# Patient Record
Sex: Female | Born: 1975 | Race: White | Hispanic: No | Marital: Married | Smoking: Former smoker
Health system: Southern US, Community
[De-identification: ages and names within clinical notes are randomized; demographics above are authoritative.]

## PROBLEM LIST (undated history)

## (undated) DIAGNOSIS — J189 Pneumonia, unspecified organism: Secondary | ICD-10-CM

## (undated) DIAGNOSIS — J45909 Unspecified asthma, uncomplicated: Secondary | ICD-10-CM

## (undated) DIAGNOSIS — I2699 Other pulmonary embolism without acute cor pulmonale: Secondary | ICD-10-CM

## (undated) DIAGNOSIS — M549 Dorsalgia, unspecified: Secondary | ICD-10-CM

## (undated) DIAGNOSIS — F909 Attention-deficit hyperactivity disorder, unspecified type: Secondary | ICD-10-CM

## (undated) HISTORY — PX: TONSILLECTOMY: SUR1361

## (undated) HISTORY — PX: BACK SURGERY: SHX140

## (undated) HISTORY — DX: Pneumonia, unspecified organism: J18.9

---

## 2015-07-24 ENCOUNTER — Other Ambulatory Visit: Payer: Self-pay | Admitting: Pediatrics

## 2015-07-24 DIAGNOSIS — Z1231 Encounter for screening mammogram for malignant neoplasm of breast: Secondary | ICD-10-CM

## 2015-08-11 ENCOUNTER — Ambulatory Visit: Payer: Self-pay

## 2016-03-20 ENCOUNTER — Emergency Department: Payer: BLUE CROSS/BLUE SHIELD

## 2016-03-20 ENCOUNTER — Emergency Department
Admission: EM | Admit: 2016-03-20 | Discharge: 2016-03-20 | Disposition: A | Discharge status: Home / Self Care | Payer: BLUE CROSS/BLUE SHIELD | Attending: Emergency Medicine | Admitting: Emergency Medicine

## 2016-03-20 DIAGNOSIS — F909 Attention-deficit hyperactivity disorder, unspecified type: Secondary | ICD-10-CM | POA: Insufficient documentation

## 2016-03-20 DIAGNOSIS — R0602 Shortness of breath: Secondary | ICD-10-CM | POA: Diagnosis not present

## 2016-03-20 DIAGNOSIS — Z87891 Personal history of nicotine dependence: Secondary | ICD-10-CM | POA: Diagnosis not present

## 2016-03-20 HISTORY — DX: Attention-deficit hyperactivity disorder, unspecified type: F90.9

## 2016-03-20 LAB — COMPREHENSIVE METABOLIC PANEL
ALBUMIN: 3.9 g/dL (ref 3.5–5.0)
ALT: 26 U/L (ref 14–54)
ANION GAP: 7 (ref 5–15)
AST: 22 U/L (ref 15–41)
Alkaline Phosphatase: 49 U/L (ref 38–126)
BILIRUBIN TOTAL: 0.5 mg/dL (ref 0.3–1.2)
BUN: 13 mg/dL (ref 6–20)
CHLORIDE: 107 mmol/L (ref 101–111)
CO2: 24 mmol/L (ref 22–32)
Calcium: 9.3 mg/dL (ref 8.9–10.3)
Creatinine, Ser: 0.81 mg/dL (ref 0.44–1.00)
GFR calc Af Amer: >60 mL/min (ref >60)
GLUCOSE: 147 mg/dL — AB (ref 65–99)
POTASSIUM: 4.2 mmol/L (ref 3.5–5.1)
Sodium: 138 mmol/L (ref 135–145)
TOTAL PROTEIN: 7.1 g/dL (ref 6.5–8.1)

## 2016-03-20 LAB — INFLUENZA PANEL BY PCR (TYPE A & B)
Influenza A By PCR: NEGATIVE
Influenza B By PCR: NEGATIVE

## 2016-03-20 LAB — CBC WITH DIFFERENTIAL/PLATELET
BASOS ABS: 0.1 10*3/uL (ref 0–0.1)
Basophils Relative: 1 %
Eosinophils Absolute: 0.4 10*3/uL (ref 0–0.7)
Eosinophils Relative: 4 %
HEMATOCRIT: 43.1 % (ref 35.0–47.0)
HEMOGLOBIN: 14.5 g/dL (ref 12.0–16.0)
LYMPHS ABS: 3.4 10*3/uL (ref 1.0–3.6)
LYMPHS PCT: 36 %
MCH: 29.6 pg (ref 26.0–34.0)
MCHC: 33.6 g/dL (ref 32.0–36.0)
MCV: 88.3 fL (ref 80.0–100.0)
Monocytes Absolute: 0.6 10*3/uL (ref 0.2–0.9)
Monocytes Relative: 6 %
NEUTROS ABS: 4.9 10*3/uL (ref 1.4–6.5)
Neutrophils Relative %: 53 %
Platelets: 306 10*3/uL (ref 150–440)
RBC: 4.88 MIL/uL (ref 3.80–5.20)
RDW: 12.7 % (ref 11.5–14.5)
WBC: 9.3 10*3/uL (ref 3.6–11.0)

## 2016-03-20 LAB — TROPONIN I

## 2016-03-20 LAB — FIBRIN DERIVATIVES D-DIMER (ARMC ONLY): FIBRIN DERIVATIVES D-DIMER (ARMC): 263 (ref 0–499)

## 2016-03-20 MED ORDER — PREDNISONE 20 MG PO TABS
ORAL_TABLET | ORAL | Status: AC
  Filled 2016-03-20: qty 3

## 2016-03-20 MED ORDER — PREDNISONE 20 MG PO TABS
60.0000 mg | ORAL_TABLET | Freq: Once | ORAL | Status: AC
  Administered 2016-03-20: 60 mg via ORAL

## 2016-03-20 MED ORDER — PREDNISONE 20 MG PO TABS: 40.0000 mg | ORAL_TABLET | Freq: Every day | ORAL | 0 refills | Status: DC

## 2016-03-20 NOTE — ED Provider Notes (Signed)
[facility] Emergency Department Provider Note  Time seen: 6:16 PM  I have reviewed the triage vital signs and the nursing notes.   HISTORY  Chief Complaint Shortness of Breath    HPI Dana Lewis is a 41 y.o. female with a past medical history of ADHD who presents to the emergency department with shortness breath. According to the patient her and her husband had gotten over nor virus approximately one week ago however for the past one week the patient has been feeling short of breath. Denies any fever or cough. Was seen by her primary care doctor 5 days ago and diagnosed with a likely bacterial illness other the patient states the chest x-ray she took was negative. She was given a course of Zithromax. Patient completed this yesterday but continues to feel short of breath especially with exertion. Patient states a history of shortness breath for which she is prescribed an albuterol inhaler, she has been using but continues to feel like she cannot get a full deep breath of air in. Denies any chest pain. Denies any leg pain or swelling. Denies any hormone use. States one episode of nausea yesterday but none today.  Past Medical History:  Diagnosis Date  . ADHD     There are no active problems to display for this patient.   Past Surgical History:  Procedure Laterality Date  . BACK SURGERY     2016    Prior to Admission medications   Not on File    No Known Allergies  No family history on file.  Social History Social History  Substance Use Topics  . Smoking status: Former Research scientist (life sciences)  . Smokeless tobacco: Never Used  . Alcohol use Not on file    Review of Systems Constitutional: Negative for fever. Cardiovascular: Negative for chest pain. Respiratory: Positive for shortness of breath. Gastrointestinal: Negative for abdominal pain Neurological: Negative for headache 10-point ROS otherwise  negative.  ____________________________________________   PHYSICAL EXAM:  VITAL SIGNS: ED Triage Vitals [03/20/16 1528]  Enc Vitals Group     BP (!) 131/93     Pulse Rate (!) 109     Resp 20     Temp 98.4 F (36.9 C)     Temp Source Oral     SpO2 96 %     Weight 221 lb (100.2 kg)     Height 5\' 3"  (1.6 m)     Head Circumference      Peak Flow      Pain Score 0     Pain Loc      Pain Edu?      Excl. in [place]?     Constitutional: Alert and oriented. Well appearing and in no distress. Eyes: Normal exam ENT   Head: Normocephalic and atraumatic   Mouth/Throat: Mucous membranes are moist. Cardiovascular: Normal rate, regular rhythmAround 100 bpm. No murmur. Respiratory: Normal respiratory effort without tachypnea nor retractions. Breath sounds are clear  Gastrointestinal: Soft and nontender. No distention.   Musculoskeletal: Nontender with normal range of motion in all extremities.  Neurologic:  Normal speech and language. No gross focal neurologic deficits Skin:  Skin is warm, dry and intact.  Psychiatric: Mood and affect are normal.  ____________________________________________    EKG  EKG reviewed and interpreted by myself shows normal sinus rhythm at 97 bpm, narrow QRS, normal axis, normal intervals, no concerning ST changes.  ____________________________________________    RADIOLOGY  Chest x-ray negative  ____________________________________________   INITIAL IMPRESSION / ASSESSMENT  AND PLAN / ED COURSE  Pertinent labs & imaging results that were available during my care of the patient were reviewed by me and considered in my medical decision making (see chart for details).  Patient presents emergency department with shortness of breath, feeling like she cannot get a full breath of air in. Denies any chest pain. Overall the patient appears very well, clear lung sounds, no wheeze, largely normal vitals besides mild tachycardia. Given the patient's mild  tachycardia with shortness of breath symptoms we will obtain a d-dimer and troponin. We will also obtain an influenza swab. Overall the patient appears very well, no distress.  Patient's labs are largely within normal limits. Influenza negative. D-dimer negative. Troponin negative. Patient's chest x-ray is normal. EKG is reassuring. As the patient is already undergone a trial of antibiotics we will place the patient on a short course of steroids and have her follow-up with a pulmonologist. Patient will call Monday to arrange a follow-up appointment.  ____________________________________________   FINAL CLINICAL IMPRESSION(S) / ED DIAGNOSES  Shortness of breath    Harvest Dark, MD 03/20/16 [phone]

## 2016-03-20 NOTE — ED Triage Notes (Signed)
Pt reports to ED w/ c/o SHOB.  Pt sts that she was seen Monday at PCP and dx w/ bacterial lung infection and prescribed Z-pack.  Pt sts that she was feeling better until yesterday when she began to feel [facility] again.  Pt sts she took all zpack.  Denies CP, fevers or LOC.  +PO intake.

## 2016-03-20 NOTE — Discharge Instructions (Signed)
Please call the number provided for pulmonology to arrange a follow-up appointment. Return to the emergency department for any chest pain, worsening trouble breathing, or any other symptom personally concerning to self. Please take your prescribed prednisone for its entire course as written.

## 2016-03-20 NOTE — ED Notes (Signed)

## 2016-03-25 LAB — CULTURE, BLOOD (ROUTINE X 2): Culture: NO GROWTH

## 2016-03-29 ENCOUNTER — Encounter: Payer: Self-pay | Admitting: Radiology

## 2016-03-29 ENCOUNTER — Observation Stay
Admission: EM | Admit: 2016-03-29 | Discharge: 2016-03-30 | Disposition: A | Discharge status: Home / Self Care | Payer: BLUE CROSS/BLUE SHIELD | Attending: Internal Medicine | Admitting: Internal Medicine

## 2016-03-29 ENCOUNTER — Emergency Department: Payer: BLUE CROSS/BLUE SHIELD

## 2016-03-29 ENCOUNTER — Observation Stay: Payer: BLUE CROSS/BLUE SHIELD

## 2016-03-29 ENCOUNTER — Observation Stay (HOSPITAL_BASED_OUTPATIENT_CLINIC_OR_DEPARTMENT_OTHER)
Admit: 2016-03-29 | Discharge: 2016-03-29 | Disposition: A | Discharge status: Home / Self Care | Payer: BLUE CROSS/BLUE SHIELD | Attending: Internal Medicine | Admitting: Internal Medicine

## 2016-03-29 DIAGNOSIS — R000 Tachycardia, unspecified: Secondary | ICD-10-CM | POA: Insufficient documentation

## 2016-03-29 DIAGNOSIS — F909 Attention-deficit hyperactivity disorder, unspecified type: Secondary | ICD-10-CM | POA: Insufficient documentation

## 2016-03-29 DIAGNOSIS — Z87891 Personal history of nicotine dependence: Secondary | ICD-10-CM | POA: Diagnosis not present

## 2016-03-29 DIAGNOSIS — I2699 Other pulmonary embolism without acute cor pulmonale: Principal | ICD-10-CM | POA: Insufficient documentation

## 2016-03-29 DIAGNOSIS — R0602 Shortness of breath: Secondary | ICD-10-CM

## 2016-03-29 DIAGNOSIS — Z91018 Allergy to other foods: Secondary | ICD-10-CM | POA: Diagnosis not present

## 2016-03-29 DIAGNOSIS — F419 Anxiety disorder, unspecified: Secondary | ICD-10-CM | POA: Insufficient documentation

## 2016-03-29 DIAGNOSIS — R0789 Other chest pain: Secondary | ICD-10-CM | POA: Insufficient documentation

## 2016-03-29 DIAGNOSIS — R609 Edema, unspecified: Secondary | ICD-10-CM

## 2016-03-29 DIAGNOSIS — M7989 Other specified soft tissue disorders: Secondary | ICD-10-CM | POA: Insufficient documentation

## 2016-03-29 HISTORY — DX: Dorsalgia, unspecified: M54.9

## 2016-03-29 LAB — CBC
HCT: 42 % (ref 35.0–47.0)
HEMOGLOBIN: 14.4 g/dL (ref 12.0–16.0)
MCH: 30 pg (ref 26.0–34.0)
MCHC: 34.2 g/dL (ref 32.0–36.0)
MCV: 87.6 fL (ref 80.0–100.0)
PLATELETS: 332 10*3/uL (ref 150–440)
RBC: 4.8 MIL/uL (ref 3.80–5.20)
RDW: 13.1 % (ref 11.5–14.5)
WBC: 13.5 10*3/uL — AB (ref 3.6–11.0)

## 2016-03-29 LAB — ECHOCARDIOGRAM COMPLETE
E/e' ratio: 5.49
EWDT: 250 ms
FS: 34 % (ref 28–44)
HEIGHTINCHES: 63 in
IVS/LV PW RATIO, ED: 0.92
LA ID, A-P, ES: 36 mm
LA diam end sys: 36 mm
LA diam index: 1.66 cm/m2
LAVOLA4C: 38.3 mL
LV PW d: 8.59 mm — AB (ref 0.6–1.1)
LV TDI E'MEDIAL: 11.1
LV e' LATERAL: 17.5 cm/s
LVEEAVG: 5.49
LVEEMED: 5.49
MV Dec: 250
MV Peak grad: 4 mmHg
MV pk A vel: 110 m/s
MV pk E vel: 96 m/s
RV LATERAL S' VELOCITY: 12.9 cm/s
RV TAPSE: 23.5 mm
TDI e' lateral: 17.5
Weight: 3555.2 oz

## 2016-03-29 LAB — BASIC METABOLIC PANEL
ANION GAP: 9 (ref 5–15)
BUN: 11 mg/dL (ref 6–20)
CALCIUM: 9.4 mg/dL (ref 8.9–10.3)
CHLORIDE: 102 mmol/L (ref 101–111)
CO2: 25 mmol/L (ref 22–32)
Creatinine, Ser: 0.97 mg/dL (ref 0.44–1.00)
GFR calc non Af Amer: >60 mL/min (ref >60)
Glucose, Bld: 125 mg/dL — ABNORMAL HIGH (ref 65–99)
Potassium: 3.6 mmol/L (ref 3.5–5.1)
SODIUM: 136 mmol/L (ref 135–145)

## 2016-03-29 LAB — ANTITHROMBIN III: ANTITHROMB III FUNC: 111 % (ref 75–120)

## 2016-03-29 LAB — PROTIME-INR
INR: 1.86
Prothrombin Time: 21.7 seconds — ABNORMAL HIGH (ref 11.4–15.2)

## 2016-03-29 LAB — TROPONIN I

## 2016-03-29 LAB — BRAIN NATRIURETIC PEPTIDE: B Natriuretic Peptide: 29 pg/mL (ref 0.0–100.0)

## 2016-03-29 LAB — APTT: aPTT: 32 seconds (ref 24–36)

## 2016-03-29 MED ORDER — SODIUM CHLORIDE 0.9 % IV SOLN
INTRAVENOUS | Status: DC
  Administered 2016-03-29 – 2016-03-30 (×2): via INTRAVENOUS

## 2016-03-29 MED ORDER — IOPAMIDOL (ISOVUE-370) INJECTION 76%
75.0000 mL | Freq: Once | INTRAVENOUS | Status: AC | PRN
  Administered 2016-03-29: 75 mL via INTRAVENOUS

## 2016-03-29 MED ORDER — LISDEXAMFETAMINE DIMESYLATE 70 MG PO CAPS
70.0000 mg | ORAL_CAPSULE | Freq: Every day | ORAL | Status: DC
  Administered 2016-03-29 – 2016-03-30 (×2): 70 mg via ORAL
  Filled 2016-03-29 (×2): qty 1

## 2016-03-29 MED ORDER — DEXTROAMPHETAMINE SULFATE 5 MG PO TABS: 10.0000 mg | ORAL_TABLET | Freq: Every day | ORAL | Status: DC | PRN

## 2016-03-29 MED ORDER — IPRATROPIUM-ALBUTEROL 0.5-2.5 (3) MG/3ML IN SOLN
3.0000 mL | Freq: Once | RESPIRATORY_TRACT | Status: AC
  Administered 2016-03-29: 3 mL via RESPIRATORY_TRACT
  Filled 2016-03-29: qty 3

## 2016-03-29 MED ORDER — RIVAROXABAN (XARELTO) VTE STARTER PACK (15 & 20 MG): ORAL_TABLET | ORAL | 0 refills | Status: DC

## 2016-03-29 MED ORDER — LORAZEPAM 0.5 MG PO TABS
0.5000 mg | ORAL_TABLET | Freq: Once | ORAL | Status: AC
  Administered 2016-03-29: 0.5 mg via ORAL
  Filled 2016-03-29: qty 1

## 2016-03-29 MED ORDER — POLYETHYLENE GLYCOL 3350 17 G PO PACK: 17.0000 g | PACK | Freq: Every day | ORAL | Status: DC | PRN

## 2016-03-29 MED ORDER — RIVAROXABAN 15 MG PO TABS
15.0000 mg | ORAL_TABLET | Freq: Once | ORAL | Status: AC
  Administered 2016-03-29: 15 mg via ORAL
  Filled 2016-03-29: qty 1

## 2016-03-29 MED ORDER — KETOROLAC TROMETHAMINE 30 MG/ML IJ SOLN
30.0000 mg | Freq: Four times a day (QID) | INTRAMUSCULAR | Status: DC | PRN
  Administered 2016-03-29: 12:00:00 30 mg via INTRAVENOUS
  Filled 2016-03-29: qty 1

## 2016-03-29 MED ORDER — TRAMADOL HCL 50 MG PO TABS
50.0000 mg | ORAL_TABLET | Freq: Two times a day (BID) | ORAL | Status: DC
  Administered 2016-03-29 (×2): 50 mg via ORAL
  Filled 2016-03-29 (×2): qty 1

## 2016-03-29 MED ORDER — SODIUM CHLORIDE 0.9 % IV BOLUS (SEPSIS)
1000.0000 mL | Freq: Once | INTRAVENOUS | Status: AC
  Administered 2016-03-29: 1000 mL via INTRAVENOUS

## 2016-03-29 MED ORDER — ALPRAZOLAM 0.5 MG PO TABS
0.5000 mg | ORAL_TABLET | Freq: Three times a day (TID) | ORAL | Status: DC | PRN
  Administered 2016-03-29 – 2016-03-30 (×2): 0.5 mg via ORAL
  Filled 2016-03-29 (×2): qty 1

## 2016-03-29 MED ORDER — ACETAMINOPHEN 325 MG PO TABS: 650.0000 mg | ORAL_TABLET | Freq: Four times a day (QID) | ORAL | Status: DC | PRN

## 2016-03-29 MED ORDER — DEXTROAMPHETAMINE SULFATE 5 MG PO TABS: 20.0000 mg | ORAL_TABLET | Freq: Every day | ORAL | Status: DC | PRN

## 2016-03-29 MED ORDER — IPRATROPIUM-ALBUTEROL 0.5-2.5 (3) MG/3ML IN SOLN: 3.0000 mL | Freq: Four times a day (QID) | RESPIRATORY_TRACT | Status: DC | PRN

## 2016-03-29 MED ORDER — HEPARIN (PORCINE) IN NACL 100-0.45 UNIT/ML-% IJ SOLN
1100.0000 [IU]/h | INTRAMUSCULAR | Status: DC
  Administered 2016-03-29: 1000 [IU]/h via INTRAVENOUS
  Filled 2016-03-29: qty 250

## 2016-03-29 MED ORDER — ACETAMINOPHEN 650 MG RE SUPP
650.0000 mg | Freq: Four times a day (QID) | RECTAL | Status: DC | PRN
Start: 2016-03-29 — End: 2016-03-30

## 2016-03-29 MED ORDER — DOCUSATE SODIUM 100 MG PO CAPS
100.0000 mg | ORAL_CAPSULE | Freq: Two times a day (BID) | ORAL | Status: DC
  Administered 2016-03-29 – 2016-03-30 (×3): 100 mg via ORAL
  Filled 2016-03-29 (×3): qty 1

## 2016-03-29 MED ORDER — IBUPROFEN 400 MG PO TABS
400.0000 mg | ORAL_TABLET | Freq: Four times a day (QID) | ORAL | Status: DC | PRN
  Filled 2016-03-29: qty 1

## 2016-03-29 NOTE — ED Provider Notes (Signed)
[facility] Emergency Department Provider Note   ____________________________________________   First MD Initiated Contact with Patient 03/29/16 0319     (approximate)  I have reviewed the triage vital signs and the nursing notes.   HISTORY  Chief Complaint Shortness of Breath and Chest Pain    HPI Dana Lewis is a 41 y.o. female who comes into the hospital today with shortness of breath. The patient reports that she just can't catch her breath. She reports that she thought it had been getting better she was seen here on the third but today at lunch the shortness of breath returned. She reports that she's been having to use her inhaler all day. She was here last aware was told that she had some inflammation in her left lung and she is unsure if that might cause any of her symptoms. 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 When she laid down and was more difficult to breathe. The patient reports that the inhaler all day has not been helping. She also had some pain and discomfort in her left upper chest. She reports that prior to her being seen here should been placed on a Z-Pak by her primary care physician. She reports that she just feels like she cannot catch a full breath. The patient rates her pain a 7 out of 10 in intensity. She is here today for evaluation.   Past Medical History:  Diagnosis Date  . ADHD     There are no active problems to display for this patient.   Past Surgical History:  Procedure Laterality Date  . BACK SURGERY     2016    Prior to Admission medications   Medication Sig Start Date End Date Taking? Authorizing Provider  albuterol (PROVENTIL HFA;VENTOLIN HFA) 108 (90 Base) MCG/ACT inhaler Inhale 2 puffs into the lungs every 4 (four) hours as needed for wheezing or shortness of breath.   Yes Historical Provider, MD  dextroamphetamine (DEXTROSTAT) 10 MG tablet Take 10-20 mg by mouth daily as needed  (attention). Patient takes as needed around 15:00    Yes Historical Provider, MD  lisdexamfetamine (VYVANSE) 70 MG capsule Take 70 mg by mouth daily.   Yes Historical Provider, MD  Rivaroxaban 15 & 20 MG TBPK Take as directed on package: Start with one 15mg  tablet by mouth twice a day with food. On Day 22, switch to one 20mg  tablet once a day with food. 03/29/16   Loney Hering, MD    Allergies Blueberry flavor  No family history on file.  Social History Social History  Substance Use Topics  . Smoking status: Former Research scientist (life sciences)  . Smokeless tobacco: Never Used  . Alcohol use Not on file    Review of Systems Constitutional: No fever/chills Eyes: No visual changes. ENT: No sore throat. Cardiovascular:  chest pain. Respiratory:  shortness of breath. Gastrointestinal: No abdominal pain.  No nausea, no vomiting.  No diarrhea.  No constipation. Genitourinary: Negative for dysuria. Musculoskeletal: Negative for back pain. Skin: Negative for rash. Neurological: Negative for headaches, focal weakness or numbness.  10-point ROS otherwise negative.  ____________________________________________   PHYSICAL EXAM:  VITAL SIGNS: ED Triage Vitals  Enc Vitals Group     BP 03/29/16 0232 119/81     Pulse Rate 03/29/16 0232 (!) 130     Resp 03/29/16 0232 15     Temp 03/29/16 0232 98.6 F (37 C)     Temp Source 03/29/16  0232 Oral     SpO2 03/29/16 0232 98 %     Weight 03/29/16 0233 221 lb (100.2 kg)     Height 03/29/16 0233 5\' 3"  (1.6 m)     Head Circumference --      Peak Flow --      Pain Score 03/29/16 0234 7     Pain Loc --      Pain Edu? --      Excl. in [place]? --     Constitutional: Alert and oriented. Well appearing and in Moderate respiratory distress. Eyes: Conjunctivae are normal. PERRL. EOMI. Head: Atraumatic. Nose: No congestion/rhinnorhea. Mouth/Throat: Mucous membranes are moist.  Oropharynx non-erythematous. Cardiovascular: Tachycardia, regular rhythm. Grossly normal  heart sounds.  Good peripheral circulation. Respiratory: Increased respiratory effort.  No retractions. Lungs CTAB. Gastrointestinal: Soft and nontender. No distention. Positive bowel sounds Musculoskeletal: No lower extremity tenderness nor edema.   Neurologic:  Normal speech and language.  Skin:  Skin is warm, dry and intact.  Psychiatric: Mood and affect are normal.   ____________________________________________   LABS (all labs ordered are listed, but only abnormal results are displayed)  Labs Reviewed  BASIC METABOLIC PANEL - Abnormal; Notable for the following:       Result Value   Glucose, Bld 125 (*)    All other components within normal limits  CBC - Abnormal; Notable for the following:    WBC 13.5 (*)    All other components within normal limits  TROPONIN I  BRAIN NATRIURETIC PEPTIDE   ____________________________________________  EKG  ED ECG REPORT I, Loney Hering, the attending physician, personally viewed and interpreted this ECG.   Date: 03/29/2016  EKG Time: 230  Rate: 129  Rhythm: sinus tachycardia  Axis: normal  Intervals:none  ST&T Change: none  ____________________________________________  RADIOLOGY  CXR CT angio chest ____________________________________________   PROCEDURES  Procedure(s) performed: None  Procedures  Critical Care performed: No  ____________________________________________   INITIAL IMPRESSION / ASSESSMENT AND PLAN / ED COURSE  Pertinent labs & imaging results that were available during my care of the patient were reviewed by me and considered in my medical decision making (see chart for details).  This is a 41 year old female who comes into the hospital today with some shortness of breath. The patient has been battling this for multiple weeks but she reports that she feels very short of breath and can't. The patient did receive a DuoNeb in triage and she reports that helped a little bit but not much. I will  give the patient a dose of Ativan as she is very anxious and scared about what is going on with her breathing. The patient did receive a chest x-ray and as she was seen previously and received a negative d-dimer I will send the patient for a CT angios of her chest. I will also check a BNP and a repeat troponin.  Clinical Course as of Mar 29 838  Mon Mar 29, 2016  0357 No active cardiopulmonary disease. DG Chest 2 View [AW]  [phone] Positive for segmental pulmonary embolus in the right posterior lower lobe. No evidence for right heart strain.   CT Angio Chest PE W and/or Wo Contrast [AW]    Clinical Course User Index [AW] Loney Hering, MD   The patient does have an pulmonary embolus. I did give her a dose of Xarelto. She is still tachycardic. I will give her a liter of normal saline. She still had somewhat tachycardia after the  liters I will give her a second liter. Her care will be signed out to the oncoming physician. If her tachycardia persists we will admit her to the hospitalist service. Not she will be discharged home with some anticoagulation and some follow-up with her primary care physician.  ____________________________________________   FINAL CLINICAL IMPRESSION(S) / ED DIAGNOSES  Final diagnoses:  Shortness of breath  Other acute pulmonary embolism without acute cor pulmonale (HCC)      NEW MEDICATIONS STARTED DURING THIS VISIT:  New Prescriptions   RIVAROXABAN 15 & 20 MG TBPK    Take as directed on package: Start with one 15mg  tablet by mouth twice a day with food. On Day 22, switch to one 20mg  tablet once a day with food.     Note:  This document was prepared using Dragon voice recognition software and may include unintentional dictation errors.    Loney Hering, MD 03/29/16 [phone]

## 2016-03-29 NOTE — ED Notes (Signed)
Patient transported to Ultrasound 

## 2016-03-29 NOTE — Progress Notes (Signed)
ANTICOAGULATION CONSULT NOTE - Initial Consult  Pharmacy Consult for Heparin dosing Indication: pulmonary embolus  Allergies  Allergen Reactions  . Blueberry Flavor Anaphylaxis    Patient Measurements: Height: 5\' 3"  (160 cm) Weight: 222 lb 3.2 oz (100.8 kg) IBW/kg (Calculated) : 52.4 Heparin Dosing Weight: 75.6 kg  Vital Signs: Temp: 97.8 F (36.6 C) (02/12 1138) Temp Source: Oral (02/12 1138) BP: 115/73 (02/12 1138) Pulse Rate: 100 (02/12 1138)  Labs:  Recent Labs  03/29/16 0248 03/29/16 0943  HGB 14.4  --   HCT 42.0  --   PLT 332  --   APTT  --  32  LABPROT  --  21.7*  INR  --  1.86  CREATININE 0.97  --   TROPONINI <0.03  --     Estimated Creatinine Clearance: 87.4 mL/min (by C-G formula based on SCr of 0.97 mg/dL).   Medical History: Past Medical History:  Diagnosis Date  . ADHD     Medications:  Scheduled:  . docusate sodium  100 mg Oral BID  . lisdexamfetamine  70 mg Oral Daily    Assessment: Patient admitted for SOB found to have pulmonary embolism on CT Patient received one dose of rivaroxaban 15 mg in the ED 2/12 @ 0530.  Goal of Therapy:  Heparin level 0.3-0.7 units/ml Monitor platelets by anticoagulation protocol: Yes   Plan:  Start heparin infusion at 1000 units/hr 12 hours after rivaroxaban dose ~ half-life 5 - 9 hours. Will omit bolus and check heparin level 6 hours after start of infusion at 2/12 @ 2330.  Thank you for this consult.  Tobie Lords, PharmD, BCPS Clinical Pharmacist 03/29/2016

## 2016-03-29 NOTE — ED Notes (Signed)
Patient transported to CT 

## 2016-03-29 NOTE — H&P (Signed)
[facility] NAME: Dana Lewis    MR#:  [id number]  DATE OF BIRTH:  1975/09/28  DATE OF ADMISSION:  03/29/2016  PRIMARY CARE PHYSICIAN: Barbaraann Boys, MD   REQUESTING/REFERRING PHYSICIAN: Dr. Delman Kitten  CHIEF COMPLAINT:   Chief Complaint  Patient presents with  . Shortness of Breath  . Chest Pain    HISTORY OF PRESENT ILLNESS:  Dana Lewis  is a 41 y.o. female with a known history of ADHD, back pain presents to hospital secondary to 2 week history of shortness of breath. Patient's symptoms started about a month ago, all her family members were affected with upper respiratory infection and flu at the time. She denies any fevers or chills, nausea or vomiting. She went to see her PCP about 2 weeks ago and was started on azithromycin. Symptoms improved temporarily. Her cough has resolved. Then she started having chest pain on the left side of the chest for which she came to the emergency room one week ago. All the labs at the time were normal and she was discharged home. Her symptoms did not improve and yesterday she couldn't breathe at all, her chest pain came again and so came back. CT of the chest showing right lower lobe segmental pulmonary embolism. Denies any family history, not on any estrogen or for control medications. She has been a little sedentary relatively in the last couple weeks due to her back pain. Denies any long distance travel. ER physician wanted to discharge her on oral anticoagulation, however patient was symptomatically dyspneic and tachycardic so being admitted for the same.  PAST MEDICAL HISTORY:   Past Medical History:  Diagnosis Date  . ADHD   . Back pain     PAST SURGICAL HISTORY:   Past Surgical History:  Procedure Laterality Date  . BACK SURGERY     2016    SOCIAL HISTORY:   Social History  Substance Use Topics  . Smoking status: Former Research scientist (life sciences)  . Smokeless tobacco: Never Used   Comment: quit 8 years ago  . Alcohol use Yes     Comment: occasional wine    FAMILY HISTORY:  No family history on file.  DRUG ALLERGIES:   Allergies  Allergen Reactions  . Blueberry Flavor Anaphylaxis    REVIEW OF SYSTEMS:   Review of Systems  Constitutional: Negative for chills, fever, malaise/fatigue and weight loss.  HENT: Negative for ear discharge, ear pain, hearing loss, nosebleeds and tinnitus.   Eyes: Negative for blurred vision, double vision and photophobia.  Respiratory: Positive for cough and shortness of breath. Negative for hemoptysis and wheezing.   Cardiovascular: Positive for chest pain. Negative for palpitations, orthopnea and leg swelling.  Gastrointestinal: Negative for abdominal pain, constipation, diarrhea, heartburn, melena, nausea and vomiting.  Genitourinary: Negative for dysuria, frequency, hematuria and urgency.  Musculoskeletal: Negative for back pain, myalgias and neck pain.  Skin: Negative for rash.  Neurological: Negative for dizziness, tingling, tremors, sensory change, speech change, focal weakness and headaches.  Endo/Heme/Allergies: Does not bruise/bleed easily.  Psychiatric/Behavioral: Negative for depression. The patient is nervous/anxious.     MEDICATIONS AT HOME:   Prior to Admission medications   Medication Sig Start Date End Date Taking? Authorizing Provider  albuterol (PROVENTIL HFA;VENTOLIN HFA) 108 (90 Base) MCG/ACT inhaler Inhale 2 puffs into the lungs every 4 (four) hours as needed for wheezing or shortness of breath.   Yes Historical Provider, MD  dextroamphetamine (DEXTROSTAT) 10 MG tablet Take 10-20  mg by mouth daily as needed (attention). Patient takes as needed around 15:00    Yes Historical Provider, MD  lisdexamfetamine (VYVANSE) 70 MG capsule Take 70 mg by mouth daily.   Yes Historical Provider, MD  Rivaroxaban 15 & 20 MG TBPK Take as directed on package: Start with one 15mg  tablet by mouth twice a day with food. On Day 22,  switch to one 20mg  tablet once a day with food. 03/29/16   Loney Hering, MD      VITAL SIGNS:  Blood pressure 115/73, pulse 100, temperature 97.8 F (36.6 C), temperature source Oral, resp. rate 16, height 5\' 3"  (1.6 m), weight 100.8 kg (222 lb 3.2 oz), last menstrual period 03/28/2016, SpO2 100 %.  PHYSICAL EXAMINATION:   Physical Exam  GENERAL:  41 y.o.-year-old obese patient sitting in the bed with no acute distress.  EYES: Pupils equal, round, reactive to light and accommodation. No scleral icterus. Extraocular muscles intact.  HEENT: Head atraumatic, normocephalic. Oropharynx and nasopharynx clear.  NECK:  Supple, no jugular venous distention. No thyroid enlargement, no tenderness.  LUNGS: Normal breath sounds bilaterally, no wheezing, rales,rhonchi or crepitation. No use of accessory muscles of respiration.  CARDIOVASCULAR: S1, S2 normal. No murmurs, rubs, or gallops. No chest wall tenderness ABDOMEN: Soft, nontender, nondistended. Bowel sounds present. No organomegaly or mass.  EXTREMITIES: No pedal edema, cyanosis, or clubbing.  NEUROLOGIC: Cranial nerves II through XII are intact. Muscle strength 5/5 in all extremities. Sensation intact. Gait not checked.  PSYCHIATRIC: The patient is alert and oriented x 3. Anxious appearing SKIN: No obvious rash, lesion, or ulcer.   LABORATORY PANEL:   CBC  Recent Labs Lab 03/29/16 0248  WBC 13.5*  HGB 14.4  HCT 42.0  PLT 332   ------------------------------------------------------------------------------------------------------------------  Chemistries   Recent Labs Lab 03/29/16 0248  NA 136  K 3.6  CL 102  CO2 25  GLUCOSE 125*  BUN 11  CREATININE 0.97  CALCIUM 9.4   ------------------------------------------------------------------------------------------------------------------  Cardiac Enzymes  Recent Labs Lab 03/29/16 0248  TROPONINI <0.03    ------------------------------------------------------------------------------------------------------------------  RADIOLOGY:  Dg Chest 2 View  Result Date: 03/29/2016 CLINICAL DATA:  41 y/o  F; shortness of breath. EXAM: CHEST  2 VIEW COMPARISON:  03/20/2016 chest radiograph FINDINGS: Stable heart size and mediastinal contours are within normal limits. Both lungs are clear. The visualized skeletal structures are unremarkable. IMPRESSION: No active cardiopulmonary disease. Electronically Signed   By: Kristine Garbe M.D.   On: 03/29/2016 03:48   Ct Angio Chest Pe W And/or Wo Contrast  Result Date: 03/29/2016 CLINICAL DATA:  41 y/o  F; shortness of breath and chest pain. EXAM: CT ANGIOGRAPHY CHEST WITH CONTRAST TECHNIQUE: Multidetector CT imaging of the chest was performed using the standard protocol during bolus administration of intravenous contrast. Multiplanar CT image reconstructions and MIPs were obtained to evaluate the vascular anatomy. CONTRAST:  75 cc Isovue 370 COMPARISON:  03/29/2016 chest radiograph FINDINGS: Cardiovascular: Satisfactory opacification of the pulmonary arteries to the segmental level. Filling defect within right lower lobe posterior basal segmental pulmonary artery consistent with pulmonary embolus (series 6, image 117). No evidence for right heart strain. RV/LV index =0.8. Normal heart size. No pericardial effusion. Normal caliber thoracic aorta with normal variant bovine arch. Mediastinum/Nodes: 12 mm nodule in the right lobe of the thyroid. Normal trachea. Normal mediastinal esophagus. Lungs/Pleura: Lungs are clear. No pleural effusion or pneumothorax. Upper Abdomen: No acute abnormality. Musculoskeletal: No chest wall abnormality. No acute or significant osseous findings. Review  of the MIP images confirms the above findings. IMPRESSION: Positive for segmental pulmonary embolus in the right posterior lower lobe. No evidence for right heart strain. These results  were called by telephone at the time of interpretation on 03/29/2016 at 4:53 am to Dr. Charlesetta Ivory , who verbally acknowledged these results. Electronically Signed   By: Kristine Garbe M.D.   On: 03/29/2016 04:54   US Venous Img Lower Bilateral  Result Date: 03/29/2016 CLINICAL DATA:  Lower extremity swelling. EXAM: BILATERAL LOWER EXTREMITY VENOUS DOPPLER ULTRASOUND TECHNIQUE: Gray-scale sonography with graded compression, as well as color Doppler and duplex ultrasound were performed to evaluate the lower extremity deep venous systems from the level of the common femoral vein and including the common femoral, femoral, profunda femoral, popliteal and calf veins including the posterior tibial, peroneal and gastrocnemius veins when visible. The superficial great saphenous vein was also interrogated. Spectral Doppler was utilized to evaluate flow at rest and with distal augmentation maneuvers in the common femoral, femoral and popliteal veins. COMPARISON:  None. FINDINGS: RIGHT LOWER EXTREMITY Common Femoral Vein: No evidence of thrombus. Normal compressibility, respiratory phasicity and response to augmentation. Saphenofemoral Junction: No evidence of thrombus. Normal compressibility and flow on color Doppler imaging. Profunda Femoral Vein: No evidence of thrombus. Normal compressibility and flow on color Doppler imaging. Femoral Vein: No evidence of thrombus. Normal compressibility, respiratory phasicity and response to augmentation. Popliteal Vein: No evidence of thrombus. Normal compressibility, respiratory phasicity and response to augmentation. Calf Veins: Visualized right deep calf veins are patent without thrombus. LEFT LOWER EXTREMITY Common Femoral Vein: No evidence of thrombus. Normal compressibility, respiratory phasicity and response to augmentation. Saphenofemoral Junction: No evidence of thrombus. Normal compressibility and flow on color Doppler imaging. Profunda Femoral Vein: No  evidence of thrombus. Normal compressibility and flow on color Doppler imaging. Femoral Vein: No evidence of thrombus. Normal compressibility, respiratory phasicity and response to augmentation. Popliteal Vein: No evidence of thrombus. Normal compressibility, respiratory phasicity and response to augmentation. Calf Veins: Visualized left deep calf veins are patent without thrombus. IMPRESSION: No evidence of deep venous thrombosis in the lower extremities. Electronically Signed   By: Markus Daft M.D.   On: 03/29/2016 10:50    EKG:   Orders placed or performed during the hospital encounter of 03/29/16  . ED EKG  . ED EKG  . EKG 12-Lead  . EKG 12-Lead    IMPRESSION AND PLAN:   Dana Lewis  is a 41 y.o. female with a known history of ADHD, back pain presents to hospital secondary to 2 week history of shortness of breath.  #1 right lower lobe pulmonary embolus-hypercoagulable workup sent for. Could be from her sedentary lifestyle. -Dopplers of the lower extremities ordered. Echocardiogram to rule out cardiac strain especially with her sinus tachycardia. -Started on heparin drip. Will discharge on oral anticoagulation if stable tomorrow.  #2 chest pain-her PE is in the right lower lobe, has chest pain is left anterior chest. Sounds like musculoskeletal. Will start on tramadol at this time. Troponins remained negative. Echocardiogram is pending.  #3 ADHD-continue her home medications.  #4 anxiety-added Xanax when necessary.  #5 DVT prophylaxis-already on heparin drip    All the records are reviewed and case discussed with ED provider. Management plans discussed with the patient, family and they are in agreement.  CODE STATUS: Full Code  TOTAL TIME TAKING CARE OF THIS PATIENT: 50 minutes.    Gladstone Lighter M.D on 03/29/2016 at 3:04 PM  Between 7am to 6pm -  Pager - [phone]  After 6pm go to [email or web address] - password [email or web address] [facility]  Office   [phone]  CC: Primary care physician; Barbaraann Boys, MD

## 2016-03-29 NOTE — ED Notes (Signed)
Patient used call light to call out and reported she could not breathe. This RN and student Denny Peon went into room and patient seemed to be having an anxiety attack. Patient has WNL v/s and no labored breathing was noted just patient was very upset. This RN told pt her room was ready and we would take her once she felt better. She reported she wanted to walk to the room and I explained that was not recommended since she was c/o SOB and had been diagnosed with a blood clot.

## 2016-03-29 NOTE — ED Provider Notes (Signed)
Patient remains awake alert. No distress. She does however have ongoing tachycardia  I discussed the case with pulmonology, Dr. Alva Garnet, who advises the patient should be admitted given her presentation with elevated heart rate requiring IV fluids for improvement  Patient agreeable with plan for admission. Discussed with hospitalist service.  Vitals:   03/29/16 0830 03/29/16 0842  BP:  114/85  Pulse: (!) 103   Resp: 15   Temp:        Delman Kitten, MD 03/29/16 [phone]

## 2016-03-29 NOTE — Discharge Instructions (Signed)
Please take your anticoagulation as prescribed. Should do shortness of breath worsen or they have any other complaints please return to the emergency department. Please follow up with her primary care physician.

## 2016-03-29 NOTE — ED Triage Notes (Signed)
Pt to triage via wheelchair. Pt reports she has been being treated by her primary MD for a "bacterial lung infection' for which she took a z-pack the last week of January and seen here on 03/20/16 and given steroids. Pt reports took the meds got a little better until today when the breathing got worse. Pt reports she has used her albuterol all day but not helping. Pt talking in short choppy sentences at this time. Pt also co pain to the left side of her chest.

## 2016-03-30 LAB — BASIC METABOLIC PANEL
ANION GAP: 6 (ref 5–15)
BUN: 10 mg/dL (ref 6–20)
CALCIUM: 8.6 mg/dL — AB (ref 8.9–10.3)
CO2: 23 mmol/L (ref 22–32)
Chloride: 108 mmol/L (ref 101–111)
Creatinine, Ser: 0.75 mg/dL (ref 0.44–1.00)
GFR calc Af Amer: >60 mL/min (ref >60)
Glucose, Bld: 90 mg/dL (ref 65–99)
Potassium: 4.2 mmol/L (ref 3.5–5.1)
Sodium: 137 mmol/L (ref 135–145)

## 2016-03-30 LAB — HOMOCYSTEINE: HOMOCYSTEINE-NORM: 12.9 umol/L (ref 0.0–15.0)

## 2016-03-30 LAB — HEPARIN LEVEL (UNFRACTIONATED)
HEPARIN UNFRACTIONATED: 0.53 [IU]/mL (ref 0.30–0.70)
Heparin Unfractionated: 0.88 IU/mL — ABNORMAL HIGH (ref 0.30–0.70)

## 2016-03-30 LAB — PROTEIN C ACTIVITY: Protein C Activity: 127 % (ref 73–180)

## 2016-03-30 LAB — CBC
HEMATOCRIT: 38.4 % (ref 35.0–47.0)
Hemoglobin: 13.4 g/dL (ref 12.0–16.0)
MCH: 30.8 pg (ref 26.0–34.0)
MCHC: 35 g/dL (ref 32.0–36.0)
MCV: 88.1 fL (ref 80.0–100.0)
PLATELETS: 258 10*3/uL (ref 150–440)
RBC: 4.36 MIL/uL (ref 3.80–5.20)
RDW: 12.9 % (ref 11.5–14.5)
WBC: 9 10*3/uL (ref 3.6–11.0)

## 2016-03-30 LAB — TROPONIN I

## 2016-03-30 LAB — PROTEIN S, TOTAL: Protein S Ag, Total: 85 % (ref 60–150)

## 2016-03-30 LAB — PROTEIN S ACTIVITY: PROTEIN S ACTIVITY: 137 % (ref 63–140)

## 2016-03-30 LAB — APTT
APTT: 43 s — AB (ref 24–36)
APTT: 50 s — AB (ref 24–36)

## 2016-03-30 LAB — PROTEIN C, TOTAL: Protein C, Total: 106 % (ref 60–150)

## 2016-03-30 MED ORDER — RIVAROXABAN 15 MG PO TABS
15.0000 mg | ORAL_TABLET | Freq: Two times a day (BID) | ORAL | Status: DC
  Administered 2016-03-30: 09:00:00 15 mg via ORAL
  Filled 2016-03-30: qty 1

## 2016-03-30 MED ORDER — RIVAROXABAN (XARELTO) VTE STARTER PACK (15 & 20 MG): ORAL_TABLET | ORAL | 0 refills | Status: DC

## 2016-03-30 NOTE — Progress Notes (Signed)
Discussed discharge instructions and medications with pt and her husband. IV removed. All questions addressed. Pt transported home via car by her husband.  Clarise Cruz, RN

## 2016-03-30 NOTE — Progress Notes (Signed)
ANTICOAGULATION CONSULT NOTE - Initial Consult  Pharmacy Consult for Heparin dosing Indication: pulmonary embolus  Allergies  Allergen Reactions  . Blueberry Flavor Anaphylaxis    Patient Measurements: Height: 5\' 3"  (160 cm) Weight: 222 lb 3.2 oz (100.8 kg) IBW/kg (Calculated) : 52.4 Heparin Dosing Weight: 75.6 kg  Vital Signs: Temp: 98.2 F (36.8 C) (02/12 2054) Temp Source: Oral (02/12 2054) BP: 131/83 (02/12 2054) Pulse Rate: 105 (02/12 2054)  Labs:  Recent Labs  03/29/16 0248 03/29/16 0943 03/29/16 1531 03/29/16 2102 03/29/16 2346  HGB 14.4  --   --   --   --   HCT 42.0  --   --   --   --   PLT 332  --   --   --   --   APTT  --  32  --   --  43*  LABPROT  --  21.7*  --   --   --   INR  --  1.86  --   --   --   HEPARINUNFRC  --   --   --   --  0.88*  CREATININE 0.97  --   --   --   --   TROPONINI <0.03  --  <0.03 <0.03 <0.03    Estimated Creatinine Clearance: 87.4 mL/min (by C-G formula based on SCr of 0.97 mg/dL).   Medical History: Past Medical History:  Diagnosis Date  . ADHD   . Back pain     Medications:  Scheduled:  . docusate sodium  100 mg Oral BID  . lisdexamfetamine  70 mg Oral Daily  . traMADol  50 mg Oral Q12H    Assessment: Patient admitted for SOB found to have pulmonary embolism on CT Patient received one dose of rivaroxaban 15 mg in the ED 2/12 @ 0530.  Goal of Therapy:  Heparin level 0.3-0.7 units/ml Monitor platelets by anticoagulation protocol: Yes   Plan:  Start heparin infusion at 1000 units/hr 12 hours after rivaroxaban dose ~ half-life 5 - 9 hours. Will omit bolus and check heparin level 6 hours after start of infusion at 2/12 @ 2330.  2/12 2346 HL supratherapeutic, aPTT subtherapeutic. Our policy is to dose off aPTT after Xa admin. Increase to 1100 units/hr, recheck HL and aPTT in 6 hours. Dose adust using aPTT until HL and aPTT correlate.  Thank you for this consult.  Cougar Imel A. Jordan Hawks, PharmD, BCPS Clinical  Pharmacist 03/30/2016

## 2016-03-30 NOTE — Discharge Summary (Signed)
[facility] NAME: Dana Lewis    MR#:  [id number]  DATE OF BIRTH:  [birth year]  DATE OF ADMISSION:  03/29/2016   ADMITTING PHYSICIAN: Gladstone Lighter, MD  DATE OF DISCHARGE: 03/30/2016  PRIMARY CARE PHYSICIAN: Barbaraann Boys, MD   ADMISSION DIAGNOSIS:   Shortness of breath [R06.02] Swelling [R60.9] Other acute pulmonary embolism without acute cor pulmonale (HCC) [I26.99]  DISCHARGE DIAGNOSIS:   Active Problems:   Pulmonary embolism ([place])   SECONDARY DIAGNOSIS:   Past Medical History:  Diagnosis Date  . ADHD   . Back pain     HOSPITAL COURSE:   Dana Lewis  is a 41 y.o. female with a known history of ADHD, back pain presents to hospital secondary to 2 week history of shortness of breath.  #1 right lower lobe pulmonary embolus-hypercoagulable workup sent for. Could be from her sedentary lifestyle. - Follow up with hematology as outpatient -Dopplers of the lower extremities negative. Echocardiogram normal and no cardiac strain  - sinus tachycardia resolved. As well as her chest pain resolved with tramadol --was on heparin drip. Will discharge on oral xarelto for now  #2 chest pain- musculoskeletal pain in left side of chest. troponins negative, ECHO normal - her PE is in the right lower lobe, has chest pain is left anterior chest.  - resolved with tramadol  #3 ADHD-continue her home medications.  #4 anxiety- resolved with xanax. No prescription benzo's at discharge  Discharge today  DISCHARGE CONDITIONS:   Stable  CONSULTS OBTAINED:   None  DRUG ALLERGIES:   Allergies  Allergen Reactions  . Blueberry Flavor Anaphylaxis   DISCHARGE MEDICATIONS:   Allergies as of 03/30/2016      Reactions   Blueberry Flavor Anaphylaxis      Medication List    TAKE these medications   albuterol 108 (90 Base) MCG/ACT inhaler Commonly known as:  PROVENTIL HFA;VENTOLIN HFA Inhale 2 puffs into the lungs  every 4 (four) hours as needed for wheezing or shortness of breath.   dextroamphetamine 10 MG tablet Commonly known as:  DEXTROSTAT Take 10-20 mg by mouth daily as needed (attention). Patient takes as needed around 15:00   lisdexamfetamine 70 MG capsule Commonly known as:  VYVANSE Take 70 mg by mouth daily.   Rivaroxaban 15 & 20 MG Tbpk Take as directed on package: Start with one 15mg  tablet by mouth twice a day with food. On Day 22, switch to one 20mg  tablet once a day with food.        DISCHARGE INSTRUCTIONS:   1. PCP f/u in 1-2 weeks 2. Hematology f/u in 1 week  DIET:   Cardiac diet  ACTIVITY:   Activity as tolerated  OXYGEN:   Home Oxygen: No.  Oxygen Delivery: room air  DISCHARGE LOCATION:   home   If you experience worsening of your admission symptoms, develop shortness of breath, life threatening emergency, suicidal or homicidal thoughts you must seek medical attention immediately by calling 911 or calling your MD immediately  if symptoms less severe.  You Must read complete instructions/literature along with all the possible adverse reactions/side effects for all the Medicines you take and that have been prescribed to you. Take any new Medicines after you have completely understood and accpet all the possible adverse reactions/side effects.   Please note  You were cared for by a hospitalist during your hospital stay. If you have any questions about your discharge medications or the care you received  while you were in the hospital after you are discharged, you can call the unit and asked to speak with the hospitalist on call if the hospitalist that took care of you is not available. Once you are discharged, your primary care physician will handle any further medical issues. Please note that NO REFILLS for any discharge medications will be authorized once you are discharged, as it is imperative that you return to your primary care physician (or establish a  relationship with a primary care physician if you do not have one) for your aftercare needs so that they can reassess your need for medications and monitor your lab values.    On the day of Discharge:  VITAL SIGNS:   Blood pressure (!) 113/50, pulse 89, temperature 97.5 F (36.4 C), resp. rate 20, height 5\' 3"  (1.6 m), weight 100.8 kg (222 lb 3.2 oz), last menstrual period 03/28/2016, SpO2 100 %.  PHYSICAL EXAMINATION:   GENERAL:  41 y.o.-year-old obese patient sitting in the bed with no acute distress.  EYES: Pupils equal, round, reactive to light and accommodation. No scleral icterus. Extraocular muscles intact.  HEENT: Head atraumatic, normocephalic. Oropharynx and nasopharynx clear.  NECK:  Supple, no jugular venous distention. No thyroid enlargement, no tenderness.  LUNGS: Normal breath sounds bilaterally, no wheezing, rales,rhonchi or crepitation. No use of accessory muscles of respiration.  CARDIOVASCULAR: S1, S2 normal. No murmurs, rubs, or gallops. No chest wall tenderness ABDOMEN: Soft, nontender, nondistended. Bowel sounds present. No organomegaly or mass.  EXTREMITIES: No pedal edema, cyanosis, or clubbing.  NEUROLOGIC: Cranial nerves II through XII are intact. Muscle strength 5/5 in all extremities. Sensation intact. Gait not checked.  PSYCHIATRIC: The patient is alert and oriented x 3.  SKIN: No obvious rash, lesion, or ulcer.   DATA REVIEW:   CBC  Recent Labs Lab 03/30/16 0653  WBC 9.0  HGB 13.4  HCT 38.4  PLT 258    Chemistries   Recent Labs Lab 03/30/16 0653  NA 137  K 4.2  CL 108  CO2 23  GLUCOSE 90  BUN 10  CREATININE 0.75  CALCIUM 8.6*     Microbiology Results  Results for orders placed or performed during the hospital encounter of 03/20/16  Culture, blood (Routine x 2)     Status: None   Collection Time: 03/20/16  3:34 PM  Result Value Ref Range Status   Specimen Description BLOOD L AC  Final   Special Requests BOTTLES DRAWN AEROBIC AND  ANAEROBIC  AER 9 ANA 7ML  Final   Culture NO GROWTH 5 DAYS  Final   Report Status 03/25/2016 FINAL  Final    RADIOLOGY:  US Venous Img Lower Bilateral  Result Date: 03/29/2016 CLINICAL DATA:  Lower extremity swelling. EXAM: BILATERAL LOWER EXTREMITY VENOUS DOPPLER ULTRASOUND TECHNIQUE: Gray-scale sonography with graded compression, as well as color Doppler and duplex ultrasound were performed to evaluate the lower extremity deep venous systems from the level of the common femoral vein and including the common femoral, femoral, profunda femoral, popliteal and calf veins including the posterior tibial, peroneal and gastrocnemius veins when visible. The superficial great saphenous vein was also interrogated. Spectral Doppler was utilized to evaluate flow at rest and with distal augmentation maneuvers in the common femoral, femoral and popliteal veins. COMPARISON:  None. FINDINGS: RIGHT LOWER EXTREMITY Common Femoral Vein: No evidence of thrombus. Normal compressibility, respiratory phasicity and response to augmentation. Saphenofemoral Junction: No evidence of thrombus. Normal compressibility and flow on color Doppler imaging. Profunda Femoral  Vein: No evidence of thrombus. Normal compressibility and flow on color Doppler imaging. Femoral Vein: No evidence of thrombus. Normal compressibility, respiratory phasicity and response to augmentation. Popliteal Vein: No evidence of thrombus. Normal compressibility, respiratory phasicity and response to augmentation. Calf Veins: Visualized right deep calf veins are patent without thrombus. LEFT LOWER EXTREMITY Common Femoral Vein: No evidence of thrombus. Normal compressibility, respiratory phasicity and response to augmentation. Saphenofemoral Junction: No evidence of thrombus. Normal compressibility and flow on color Doppler imaging. Profunda Femoral Vein: No evidence of thrombus. Normal compressibility and flow on color Doppler imaging. Femoral Vein: No evidence of  thrombus. Normal compressibility, respiratory phasicity and response to augmentation. Popliteal Vein: No evidence of thrombus. Normal compressibility, respiratory phasicity and response to augmentation. Calf Veins: Visualized left deep calf veins are patent without thrombus. IMPRESSION: No evidence of deep venous thrombosis in the lower extremities. Electronically Signed   By: Markus Daft M.D.   On: 03/29/2016 10:50     Management plans discussed with the patient, family and they are in agreement.  CODE STATUS:     Code Status Orders        Start     Ordered   03/29/16 1141  Full code  Continuous     03/29/16 1140    Code Status History    Date Active Date Inactive Code Status Order ID Comments User Context   This patient has a current code status but no historical code status.      TOTAL TIME TAKING CARE OF THIS PATIENT: 37 minutes.    Gladstone Lighter M.D on 03/30/2016 at 9:49 AM  Between 7am to 6pm - Pager - [phone]  After 6pm go to [email or web address] - Proofreader  [employer] [facility] [facility]  Office  [phone]  CC: Primary care physician; Barbaraann Boys, MD   Note: This dictation was prepared with Dragon dictation along with smaller phrase technology. Any transcriptional errors that result from this process are unintentional.

## 2016-03-31 LAB — DRVVT CONFIRM: DRVVT CONFIRM: 1.8 ratio — AB (ref 0.8–1.2)

## 2016-03-31 LAB — BETA-2-GLYCOPROTEIN I ABS, IGG/M/A
Beta-2-Glycoprotein I IgA: <9 GPI IgA units (ref 0–25)
Beta-2-Glycoprotein I IgM: <9 GPI IgM units (ref 0–32)

## 2016-03-31 LAB — CARDIOLIPIN ANTIBODIES, IGG, IGM, IGA
Anticardiolipin IgA: <9 APL U/mL (ref 0–11)
Anticardiolipin IgG: <9 GPL U/mL (ref 0–14)
Anticardiolipin IgM: <9 MPL U/mL (ref 0–12)

## 2016-03-31 LAB — LUPUS ANTICOAGULANT PANEL
DRVVT: 99.7 s — AB (ref 0.0–47.0)
PTT LA: 44.9 s (ref 0.0–51.9)

## 2016-03-31 LAB — DRVVT MIX: dRVVT Mix: 62.9 s — ABNORMAL HIGH (ref 0.0–47.0)

## 2016-04-02 LAB — FACTOR 5 LEIDEN

## 2016-04-04 ENCOUNTER — Encounter: Payer: Self-pay | Admitting: Emergency Medicine

## 2016-04-04 ENCOUNTER — Emergency Department: Payer: BLUE CROSS/BLUE SHIELD

## 2016-04-04 ENCOUNTER — Emergency Department
Admission: EM | Admit: 2016-04-04 | Discharge: 2016-04-04 | Disposition: A | Discharge status: Home / Self Care | Payer: BLUE CROSS/BLUE SHIELD | Attending: Emergency Medicine | Admitting: Emergency Medicine

## 2016-04-04 DIAGNOSIS — N920 Excessive and frequent menstruation with regular cycle: Secondary | ICD-10-CM | POA: Insufficient documentation

## 2016-04-04 DIAGNOSIS — Z87891 Personal history of nicotine dependence: Secondary | ICD-10-CM | POA: Insufficient documentation

## 2016-04-04 DIAGNOSIS — F909 Attention-deficit hyperactivity disorder, unspecified type: Secondary | ICD-10-CM | POA: Insufficient documentation

## 2016-04-04 DIAGNOSIS — Z79899 Other long term (current) drug therapy: Secondary | ICD-10-CM | POA: Diagnosis not present

## 2016-04-04 DIAGNOSIS — N939 Abnormal uterine and vaginal bleeding, unspecified: Secondary | ICD-10-CM | POA: Diagnosis present

## 2016-04-04 DIAGNOSIS — N898 Other specified noninflammatory disorders of vagina: Secondary | ICD-10-CM | POA: Diagnosis not present

## 2016-04-04 HISTORY — DX: Other pulmonary embolism without acute cor pulmonale: I26.99

## 2016-04-04 LAB — CBC WITH DIFFERENTIAL/PLATELET
BASOS ABS: 0.1 10*3/uL (ref 0–0.1)
BASOS PCT: 1 %
EOS PCT: 1 %
Eosinophils Absolute: 0.1 10*3/uL (ref 0–0.7)
HCT: 34.2 % — ABNORMAL LOW (ref 35.0–47.0)
Hemoglobin: 11.8 g/dL — ABNORMAL LOW (ref 12.0–16.0)
Lymphocytes Relative: 27 %
Lymphs Abs: 3.8 10*3/uL — ABNORMAL HIGH (ref 1.0–3.6)
MCH: 30.1 pg (ref 26.0–34.0)
MCHC: 34.3 g/dL (ref 32.0–36.0)
MCV: 87.6 fL (ref 80.0–100.0)
MONO ABS: 0.6 10*3/uL (ref 0.2–0.9)
MONOS PCT: 5 %
Neutro Abs: 9.4 10*3/uL — ABNORMAL HIGH (ref 1.4–6.5)
Neutrophils Relative %: 66 %
PLATELETS: 321 10*3/uL (ref 150–440)
RBC: 3.91 MIL/uL (ref 3.80–5.20)
RDW: 12.7 % (ref 11.5–14.5)
WBC: 14.2 10*3/uL — ABNORMAL HIGH (ref 3.6–11.0)

## 2016-04-04 LAB — BASIC METABOLIC PANEL
ANION GAP: 9 (ref 5–15)
BUN: 12 mg/dL (ref 6–20)
CALCIUM: 9.1 mg/dL (ref 8.9–10.3)
CO2: 24 mmol/L (ref 22–32)
CREATININE: 0.98 mg/dL (ref 0.44–1.00)
Chloride: 103 mmol/L (ref 101–111)
GLUCOSE: 104 mg/dL — AB (ref 65–99)
Potassium: 3.6 mmol/L (ref 3.5–5.1)
Sodium: 136 mmol/L (ref 135–145)

## 2016-04-04 LAB — TYPE AND SCREEN
ABO/RH(D): A POS
Antibody Screen: NEGATIVE

## 2016-04-04 LAB — HCG, QUANTITATIVE, PREGNANCY: hCG, Beta Chain, Quant, S: <1 m[IU]/mL (ref <5)

## 2016-04-04 MED ORDER — SODIUM CHLORIDE 0.9 % IV BOLUS (SEPSIS)
1000.0000 mL | Freq: Once | INTRAVENOUS | Status: AC
  Administered 2016-04-04: 1000 mL via INTRAVENOUS

## 2016-04-04 NOTE — ED Triage Notes (Signed)
Pt arrives ambulatory to triage with c/o vaginal bleeding x1 week. Pt was DC'd last week pulmonary embolism. Pt reports that she started her period the day before this and she has been having to change her tampon every 45 minutes. Pt states that she saw her primary physician yesterday who did not see a problem with her blood work. Pt also reports that she is having multiple clots and bleeding through the tampon and through the pad.

## 2016-04-04 NOTE — ED Notes (Signed)
Patient transported to Ultrasound 

## 2016-04-04 NOTE — ED Notes (Signed)
Report to felicia, rn.  

## 2016-04-04 NOTE — ED Notes (Signed)
Pt recently started xarelto for PE. Pt states she began to [place] on 03/27/2016. Pt states she is having heavy vaginal bleeding and saturating a tampon every 30 minutes. Pt with pwd skin.

## 2016-04-04 NOTE — Discharge Instructions (Signed)
1. Continue to take Xarelto 15 mg twice daily as directed for pulmonary embolism. 2. Rest and drink plenty of fluids daily. 3. Continue iron supplementation daily. 4. Return to the ER for worsening symptoms, feeling faint, difficulty breathing or other concerns.

## 2016-04-04 NOTE — ED Provider Notes (Signed)
[facility] Emergency Department Provider Note   ____________________________________________   First MD Initiated Contact with Patient 04/04/16 [phone]     (approximate)  I have reviewed the triage vital signs and the nursing notes.   HISTORY  Chief Complaint Vaginal Bleeding    HPI Dana Lewis is a 41 y.o. female who presents to the ED from home with a chief complaint of heavy vaginal bleeding. Patient began her menstrual period on 2/12. The following day she was seen in the ED for shortness of breath and diagnosed with a PE (segmental PE in the right posterior lower lobe without evidence for right heart strain). She was hospitalized; received heparin while in the hospital and discharged on Xarelto. She is currently taking Xarelto 15 mg twice daily. Complains of heavy vaginal bleeding all week. Reports seeing her PCP yesterday with normal blood work and told to take iron supplements. Reports she is passing clots and soaking through a tampon every hour. Complains of associated left pelvic pain. Denies feeling dizzy/lightheaded or faint. Denies associated chest pain, shortness of breath, nausea, vomiting, diarrhea.   Past Medical History:  Diagnosis Date  . ADHD   . Back pain   . Pulmonary embolism [facility])     Patient Active Problem List   Diagnosis Date Noted  . Pulmonary embolism ([place]) 03/29/2016    Past Surgical History:  Procedure Laterality Date  . BACK SURGERY     2016    Prior to Admission medications   Medication Sig Start Date End Date Taking? Authorizing Provider  albuterol (PROVENTIL HFA;VENTOLIN HFA) 108 (90 Base) MCG/ACT inhaler Inhale 2 puffs into the lungs every 4 (four) hours as needed for wheezing or shortness of breath.   Yes Historical Provider, MD  busPIRone (BUSPAR) 10 MG tablet Take 1 tablet by mouth 2 (two) times daily. 04/02/16  Yes Historical Provider, MD  dextroamphetamine (DEXTROSTAT) 10 MG tablet Take 10-20 mg by mouth  daily as needed (attention). Patient takes as needed around 15:00    Yes Historical Provider, MD  lisdexamfetamine (VYVANSE) 70 MG capsule Take 70 mg by mouth daily.   Yes Historical Provider, MD  Rivaroxaban 15 & 20 MG TBPK Take as directed on package: Start with one 15mg  tablet by mouth twice a day with food. On Day 22, switch to one 20mg  tablet once a day with food. 03/30/16  Yes Gladstone Lighter, MD    Allergies Blueberry flavor  No family history on file.  Social History Social History  Substance Use Topics  . Smoking status: Former Research scientist (life sciences)  . Smokeless tobacco: Never Used     Comment: quit 8 years ago  . Alcohol use Yes     Comment: occasional wine    Review of Systems  Constitutional: No fever/chills. Eyes: No visual changes. ENT: No sore throat. Cardiovascular: Denies chest pain. Respiratory: Denies shortness of breath. Gastrointestinal: Positive for pelvic cramping. No abdominal pain.  No nausea, no vomiting.  No diarrhea.  No constipation. Genitourinary: Positive for vaginal bleeding. Negative for dysuria. Musculoskeletal: Negative for back pain. Skin: Negative for rash. Neurological: Negative for headaches, focal weakness or numbness.  10-point ROS otherwise negative.  ____________________________________________   PHYSICAL EXAM:  VITAL SIGNS: ED Triage Vitals  Enc Vitals Group     BP 04/04/16 0303 131/81     Pulse Rate 04/04/16 0303 (!) 127     Resp 04/04/16 0303 16     Temp 04/04/16 0303 98.8 F (37.1 C)     Temp  Source 04/04/16 0303 Oral     SpO2 04/04/16 0303 98 %     Weight 04/04/16 0305 219 lb (99.3 kg)     Height 04/04/16 0305 5\' 3"  (1.6 m)     Head Circumference --      Peak Flow --      Pain Score 04/04/16 0305 5     Pain Loc --      Pain Edu? --      Excl. in [place]? --     Constitutional: Alert and oriented. Well appearing and in no acute distress. Eyes: Conjunctivae are normal. PERRL. EOMI. Head: Atraumatic. Nose: No  congestion/rhinnorhea. Mouth/Throat: Mucous membranes are moist.  Oropharynx non-erythematous. Neck: No stridor.   Cardiovascular: Tachycardic rate, regular rhythm. Grossly normal heart sounds.  Good peripheral circulation. Respiratory: Normal respiratory effort.  No retractions. Lungs CTAB. Gastrointestinal: Soft and minimally tender to palpation lower abdomen without rebound or guarding. No distention. No abdominal bruits. No CVA tenderness. Musculoskeletal: No lower extremity tenderness nor edema.  No joint effusions. Neurologic:  Normal speech and language. No gross focal neurologic deficits are appreciated. No gait instability. Skin:  Skin is warm, dry and intact. No rash noted. Psychiatric: Mood and affect are normal. Speech and behavior are normal.  ____________________________________________   LABS (all labs ordered are listed, but only abnormal results are displayed)  Labs Reviewed  CBC WITH DIFFERENTIAL/PLATELET - Abnormal; Notable for the following:       Result Value   WBC 14.2 (*)    Hemoglobin 11.8 (*)    HCT 34.2 (*)    Neutro Abs 9.4 (*)    Lymphs Abs 3.8 (*)    All other components within normal limits  BASIC METABOLIC PANEL - Abnormal; Notable for the following:    Glucose, Bld 104 (*)    All other components within normal limits  HCG, QUANTITATIVE, PREGNANCY  TYPE AND SCREEN   ____________________________________________  EKG  None ____________________________________________  RADIOLOGY  Pelvic ultrasound interpreted per Dr. Jeannine Boga: Negative pelvic ultrasound. No acute abnormality identified. ____________________________________________   PROCEDURES  Procedure(s) performed:   Pelvic exam: External exam WNL without rashes, lesions or vesicles. Speculum exam reveals moderate bleeding, no clots. Cervical os closed. Bimanual exam WNL.  Procedures  Critical Care performed: No  ____________________________________________   INITIAL  IMPRESSION / ASSESSMENT AND PLAN / ED COURSE  Pertinent labs & imaging results that were available during my care of the patient were reviewed by me and considered in my medical decision making (see chart for details).  41 year old female who started Xarelto one week ago for right submental posterior lobe pulmonary embolus who is experiencing heavy vaginal bleeding. H&H are unremarkable; will obtain pelvic ultrasound to evaluate for structural abnormalities. Will initiate IV fluid resuscitation. Of note, patient reports she is always tachycardic and does not feel palpitations.  Clinical Course as of Apr 04 720  Nancy Fetter Apr 04, 2016  [id number] Heart rate improving to 109 with IV fluids.  [JS]  [id number] Updated patient and her spouse of ultrasound results. Discussed with hematologist on call Dr. Grayland Ormond who recommends continuing patient on Xarelto 15 mg twice daily, especially in light of her stable hemoglobin and hematocrit, in the fact that she was just diagnosed one week ago with pulmonary embolism. I discussed this at length with the patient and her spouse who both verbalize understanding and agree with plan of care. Patient has an appointment with Dr. Grayland Ormond in 4 days. Strict return precautions given. Both verbalize understanding and  agree with plan of care.  [JS]    Clinical Course User Index [JS] Paulette Blanch, MD     ____________________________________________   FINAL CLINICAL IMPRESSION(S) / ED DIAGNOSES  Final diagnoses:  Menorrhagia with regular cycle      NEW MEDICATIONS STARTED DURING THIS VISIT:  New Prescriptions   No medications on file     Note:  This document was prepared using Dragon voice recognition software and may include unintentional dictation errors.    Paulette Blanch, MD 04/04/16 [phone]

## 2016-04-04 NOTE — ED Notes (Signed)
Pt returned from ultrasound. Assisted md with pelvic exam.

## 2016-04-05 LAB — PROTHROMBIN GENE MUTATION

## 2016-04-06 ENCOUNTER — Telehealth: Payer: Self-pay | Admitting: *Deleted

## 2016-04-06 NOTE — Telephone Encounter (Signed)
Called to report that his wife has an appt tomorrow to see Dr Grayland Ormond and has been on Xarelto times 1 week. She is having heavy bleeding for which she went to the ER for this past weekend. She continues to have heavy bleeding and is pale and weak, asking if he should take her back to the ER or wait until tomorrow to see Dr Grayland Ormond. Per Dr Grayland Ormond, if he is concerned enough to call us, he should take her to Er for evaluation and perhaps get a GYN consult. Let message on his VM to take her to ER and that if not, we would see her tomorrow as planned

## 2016-04-06 NOTE — Progress Notes (Signed)
[place]  [phone] [phone]) [phone]  ID: Dana Lewis OB: 07-May-1975  MR#: [id number]  [id number]  Patient Care Team: Barbaraann Boys, MD as PCP - General (Pediatrics)  CHIEF COMPLAINT: Pulmonary embolism  INTERVAL HISTORY: Patient is a 41 year old female with no significant past medical history who recently presented to the emergency room with increasing shortness of breath. She had been in bed for 4-5 days prior to her emergency room visit with flu. Upon evaluation she was noted to have pulmonary embolism. Patient was placed on Xarelto at that time. Over the past week patient has begun her regular menses cycle, but it has been significantly heavy to the point where she is using multiple pads a day and has had to wear Depends undergarments in addition to that. She feels weak and fatigued and has noticed increased dizziness particularly upon standing. She has no other neurologic complaints. She denies any fevers. She has good appetite and denies weight loss. She has no further chest pain or shortness of breath. She denies any hemoptysis or cough. She has no nausea, vomiting, constipation, or diarrhea. She has no melena or hematochezia. She has no urinary complaints. Patient otherwise feels well and offers no further specific complaints.  REVIEW OF SYSTEMS:   Review of Systems  Constitutional: Positive for malaise/fatigue. Negative for fever and weight loss.  Respiratory: Negative.  Negative for cough, hemoptysis and shortness of breath.   Cardiovascular: Negative.  Negative for chest pain and leg swelling.  Gastrointestinal: Negative.  Negative for abdominal pain, blood in stool and melena.  Genitourinary:       Heavy menstrual bleeding.  Musculoskeletal: Negative.   Neurological: Positive for dizziness and weakness.  Psychiatric/Behavioral: Negative.  The patient is not nervous/anxious.     As per HPI. Otherwise, a complete review of systems is  negative.  PAST MEDICAL HISTORY: Past Medical History:  Diagnosis Date  . ADHD   . Back pain   . Pulmonary embolism ([place])     PAST SURGICAL HISTORY: Past Surgical History:  Procedure Laterality Date  . BACK SURGERY     2016  . TONSILLECTOMY      FAMILY HISTORY: Family History  Problem Relation Age of Onset  . Diabetes Father   . Breast cancer Maternal Grandmother   . Diabetes Maternal Grandmother   . Diabetes Paternal Grandmother   . Stroke Paternal Grandmother     ADVANCED DIRECTIVES (Y/N):  N  HEALTH MAINTENANCE: Social History  Substance Use Topics  . Smoking status: Former Research scientist (life sciences)  . Smokeless tobacco: Never Used     Comment: quit 8 years ago  . Alcohol use Yes     Comment: occasional wine     Colonoscopy:  PAP:  Bone density:  Lipid panel:  Allergies  Allergen Reactions  . Blueberry Flavor Anaphylaxis    Current Outpatient Prescriptions  Medication Sig Dispense Refill  . albuterol (PROVENTIL HFA;VENTOLIN HFA) 108 (90 Base) MCG/ACT inhaler Inhale 2 puffs into the lungs every 4 (four) hours as needed for wheezing or shortness of breath.    . dextroamphetamine (DEXTROSTAT) 10 MG tablet Take 10-20 mg by mouth daily as needed (attention). Patient takes as needed around 15:00     . lisdexamfetamine (VYVANSE) 70 MG capsule Take 70 mg by mouth daily.    . Rivaroxaban 15 & 20 MG TBPK Take as directed on package: Start with one 15mg  tablet by mouth twice a day with food. On Day 22, switch to one 20mg  tablet once a  day with food. (Patient not taking: Reported on 04/09/2016) 51 each 0  . calcium-vitamin D (OSCAL WITH D) 500-200 MG-UNIT tablet Take 1 tablet by mouth.    . vitamin C (ASCORBIC ACID) 500 MG tablet Take 500 mg by mouth daily.     No current facility-administered medications for this visit.     OBJECTIVE: Vitals:   04/07/16 0948  BP: 113/80  Pulse: (!) 139  Temp: 98.6 F (37 C)     Body mass index is 39.05 kg/m.    ECOG FS:0 -  Asymptomatic  General: Well-developed, well-nourished, no acute distress. Eyes: Pink conjunctiva, anicteric sclera. HEENT: Normocephalic, moist mucous membranes, clear oropharnyx. Lungs: Clear to auscultation bilaterally. Heart: Regular rate and rhythm. No rubs, murmurs, or gallops. Abdomen: Soft, nontender, nondistended. No organomegaly noted, normoactive bowel sounds. Musculoskeletal: No edema, cyanosis, or clubbing. Neuro: Alert, answering all questions appropriately. Cranial nerves grossly intact. Skin: No rashes or petechiae noted. Psych: Normal affect. Lymphatics: No cervical, calvicular, axillary or inguinal LAD.   LAB RESULTS:  Lab Results  Component Value Date   NA 139 04/09/2016   K 3.8 04/09/2016   CL 107 04/09/2016   CO2 25 04/09/2016   GLUCOSE 101 (H) 04/09/2016   BUN 9 04/09/2016   CREATININE 0.87 04/09/2016   CALCIUM 8.8 (L) 04/09/2016   PROT 6.0 (L) 04/09/2016   ALBUMIN 3.3 (L) 04/09/2016   AST 26 04/09/2016   ALT 24 04/09/2016   ALKPHOS 42 04/09/2016   BILITOT 0.4 04/09/2016   GFRNONAA >60 04/09/2016   GFRAA >60 04/09/2016    Lab Results  Component Value Date   WBC 12.5 (H) 04/09/2016   NEUTROABS 7.0 (H) 04/07/2016   HGB 8.5 (L) 04/09/2016   HCT 24.4 (L) 04/09/2016   MCV 90.1 04/09/2016   PLT 301 04/09/2016     STUDIES: Dg Chest 2 View  Result Date: 03/29/2016 CLINICAL DATA:  41 y/o  F; shortness of breath. EXAM: CHEST  2 VIEW COMPARISON:  03/20/2016 chest radiograph FINDINGS: Stable heart size and mediastinal contours are within normal limits. Both lungs are clear. The visualized skeletal structures are unremarkable. IMPRESSION: No active cardiopulmonary disease. Electronically Signed   By: Kristine Garbe M.D.   On: 03/29/2016 03:48   Dg Chest 2 View  Result Date: 03/20/2016 CLINICAL DATA:  Cough, shortness of breath. EXAM: CHEST  2 VIEW COMPARISON:  None. FINDINGS: The heart size and mediastinal contours are within normal limits. Both  lungs are clear. No pneumothorax or pleural effusion is noted. The visualized skeletal structures are unremarkable. IMPRESSION: No active cardiopulmonary disease. Electronically Signed   By: Marijo Conception, M.D.   On: 03/20/2016 16:22   Ct Angio Chest Pe W And/or Wo Contrast  Result Date: 04/09/2016 CLINICAL DATA:  Shortness of breath history of PE EXAM: CT ANGIOGRAPHY CHEST WITH CONTRAST TECHNIQUE: Multidetector CT imaging of the chest was performed using the standard protocol during bolus administration of intravenous contrast. Multiplanar CT image reconstructions and MIPs were obtained to evaluate the vascular anatomy. CONTRAST:  75 mL Isovue 370 intravenous COMPARISON:  CT 03/29/2016 FINDINGS: Cardiovascular: Satisfactory opacification of the pulmonary arteries to the segmental level. No evidence of pulmonary embolism. Previously noted filling defect in the right lower lobe segmental artery is not identified on the current exam. Thoracic aorta is non aneurysmal. No dissection. Normal heart size. No pericardial effusion. Mediastinum/Nodes: No enlarged mediastinal, hilar, or axillary lymph nodes. Thyroid gland, trachea, and esophagus demonstrate no significant findings. Lungs/Pleura: Lungs are clear.  No pleural effusion or pneumothorax. Upper Abdomen: No acute abnormality. Musculoskeletal: No chest wall abnormality. No acute or significant osseous findings. Review of the MIP images confirms the above findings. IMPRESSION: 1. The previously noted filling defect in the right lower lobe segmental artery is not identified on the current exam. No acute embolus or aortic dissection is visualized 2. Clear lung fields Electronically Signed   By: Donavan Foil M.D.   On: 04/09/2016 03:41   Ct Angio Chest Pe W And/or Wo Contrast  Result Date: 03/29/2016 CLINICAL DATA:  41 y/o  F; shortness of breath and chest pain. EXAM: CT ANGIOGRAPHY CHEST WITH CONTRAST TECHNIQUE: Multidetector CT imaging of the chest was  performed using the standard protocol during bolus administration of intravenous contrast. Multiplanar CT image reconstructions and MIPs were obtained to evaluate the vascular anatomy. CONTRAST:  75 cc Isovue 370 COMPARISON:  03/29/2016 chest radiograph FINDINGS: Cardiovascular: Satisfactory opacification of the pulmonary arteries to the segmental level. Filling defect within right lower lobe posterior basal segmental pulmonary artery consistent with pulmonary embolus (series 6, image 117). No evidence for right heart strain. RV/LV index =0.8. Normal heart size. No pericardial effusion. Normal caliber thoracic aorta with normal variant bovine arch. Mediastinum/Nodes: 12 mm nodule in the right lobe of the thyroid. Normal trachea. Normal mediastinal esophagus. Lungs/Pleura: Lungs are clear. No pleural effusion or pneumothorax. Upper Abdomen: No acute abnormality. Musculoskeletal: No chest wall abnormality. No acute or significant osseous findings. Review of the MIP images confirms the above findings. IMPRESSION: Positive for segmental pulmonary embolus in the right posterior lower lobe. No evidence for right heart strain. These results were called by telephone at the time of interpretation on 03/29/2016 at 4:53 am to Dr. Charlesetta Ivory , who verbally acknowledged these results. Electronically Signed   By: Kristine Garbe M.D.   On: 03/29/2016 04:54   US Transvaginal Non-ob  Result Date: 04/04/2016 CLINICAL DATA:  Initial evaluation for heavy menstrual bleeding, on Xarelto. EXAM: TRANSABDOMINAL AND TRANSVAGINAL ULTRASOUND OF PELVIS TECHNIQUE: Both transabdominal and transvaginal ultrasound examinations of the pelvis were performed. Transabdominal technique was performed for global imaging of the pelvis including uterus, ovaries, adnexal regions, and pelvic cul-de-sac. It was necessary to proceed with endovaginal exam following the transabdominal exam to visualize the uterus and ovaries. COMPARISON:  None  available FINDINGS: Uterus Measurements: 10.2 x 3.5 x 5.5 cm. No fibroids or other mass visualized. Endometrium Thickness: 10 mm.  No focal abnormality visualized. Right ovary Measurements: 3.2 x 1.4 x 2.5 cm. Normal appearance/no adnexal mass. Left ovary Measurements: 3.0 x 2.0 x 1.6 cm. Normal appearance/no adnexal mass. Other findings No abnormal free fluid. IMPRESSION: Negative pelvic ultrasound.  No acute abnormality identified. Electronically Signed   By: Jeannine Boga M.D.   On: 04/04/2016 06:07   US Pelvis Complete  Result Date: 04/04/2016 CLINICAL DATA:  Initial evaluation for heavy menstrual bleeding, on Xarelto. EXAM: TRANSABDOMINAL AND TRANSVAGINAL ULTRASOUND OF PELVIS TECHNIQUE: Both transabdominal and transvaginal ultrasound examinations of the pelvis were performed. Transabdominal technique was performed for global imaging of the pelvis including uterus, ovaries, adnexal regions, and pelvic cul-de-sac. It was necessary to proceed with endovaginal exam following the transabdominal exam to visualize the uterus and ovaries. COMPARISON:  None available FINDINGS: Uterus Measurements: 10.2 x 3.5 x 5.5 cm. No fibroids or other mass visualized. Endometrium Thickness: 10 mm.  No focal abnormality visualized. Right ovary Measurements: 3.2 x 1.4 x 2.5 cm. Normal appearance/no adnexal mass. Left ovary Measurements: 3.0 x 2.0 x 1.6  cm. Normal appearance/no adnexal mass. Other findings No abnormal free fluid. IMPRESSION: Negative pelvic ultrasound.  No acute abnormality identified. Electronically Signed   By: Jeannine Boga M.D.   On: 04/04/2016 06:07   US Venous Img Lower Bilateral  Result Date: 03/29/2016 CLINICAL DATA:  Lower extremity swelling. EXAM: BILATERAL LOWER EXTREMITY VENOUS DOPPLER ULTRASOUND TECHNIQUE: Gray-scale sonography with graded compression, as well as color Doppler and duplex ultrasound were performed to evaluate the lower extremity deep venous systems from the level of  the common femoral vein and including the common femoral, femoral, profunda femoral, popliteal and calf veins including the posterior tibial, peroneal and gastrocnemius veins when visible. The superficial great saphenous vein was also interrogated. Spectral Doppler was utilized to evaluate flow at rest and with distal augmentation maneuvers in the common femoral, femoral and popliteal veins. COMPARISON:  None. FINDINGS: RIGHT LOWER EXTREMITY Common Femoral Vein: No evidence of thrombus. Normal compressibility, respiratory phasicity and response to augmentation. Saphenofemoral Junction: No evidence of thrombus. Normal compressibility and flow on color Doppler imaging. Profunda Femoral Vein: No evidence of thrombus. Normal compressibility and flow on color Doppler imaging. Femoral Vein: No evidence of thrombus. Normal compressibility, respiratory phasicity and response to augmentation. Popliteal Vein: No evidence of thrombus. Normal compressibility, respiratory phasicity and response to augmentation. Calf Veins: Visualized right deep calf veins are patent without thrombus. LEFT LOWER EXTREMITY Common Femoral Vein: No evidence of thrombus. Normal compressibility, respiratory phasicity and response to augmentation. Saphenofemoral Junction: No evidence of thrombus. Normal compressibility and flow on color Doppler imaging. Profunda Femoral Vein: No evidence of thrombus. Normal compressibility and flow on color Doppler imaging. Femoral Vein: No evidence of thrombus. Normal compressibility, respiratory phasicity and response to augmentation. Popliteal Vein: No evidence of thrombus. Normal compressibility, respiratory phasicity and response to augmentation. Calf Veins: Visualized left deep calf veins are patent without thrombus. IMPRESSION: No evidence of deep venous thrombosis in the lower extremities. Electronically Signed   By: Markus Daft M.D.   On: 03/29/2016 10:50    ASSESSMENT: Pulmonary embolism.  PLAN:    1.  Pulmonary embolism:  Patient noted to have a right lower lobe pulmonary embolism, likely secondary to transient risk factor of being in bed 4-5 days with the flu. Patient's hypercoagulable workup completed in the hospital was essentially negative or within normal limits. Patient will require 3-6 months of Xarelto, but treatment was stopped given her significant menstrual bleeding and anemia. 2. Anemia: Secondary to heavy menstrual bleeding after being placed on Xarelto.  A stat referral to OB/GYN was made who recommended IUD placement which will occur next week. Patient received 2 units of packed red blood cells in clinic today. She will return to clinic in 1 week for repeat laboratory work and further evaluation. 3. Menstrual bleeding: Appreciate OB/GYN input. Patient will hold Xarelto until IUD can be placed next week.  Approximately 45 minutes was spent in discussion of which greater than 50% was consultation.  Patient expressed understanding and was in agreement with this plan. She also understands that She can call clinic at any time with any questions, concerns, or complaints.    Lloyd Huger, MD   04/12/2016 7:41 PM

## 2016-04-07 ENCOUNTER — Inpatient Hospital Stay: Payer: BLUE CROSS/BLUE SHIELD

## 2016-04-07 ENCOUNTER — Encounter: Payer: Self-pay | Admitting: Oncology

## 2016-04-07 ENCOUNTER — Inpatient Hospital Stay: Payer: BLUE CROSS/BLUE SHIELD | Attending: Oncology | Admitting: Oncology

## 2016-04-07 VITALS — BP 113/80 | HR 139 | Temp 98.6°F | Wt 220.5 lb

## 2016-04-07 DIAGNOSIS — I2699 Other pulmonary embolism without acute cor pulmonale: Secondary | ICD-10-CM | POA: Diagnosis present

## 2016-04-07 DIAGNOSIS — D649 Anemia, unspecified: Secondary | ICD-10-CM

## 2016-04-07 DIAGNOSIS — F909 Attention-deficit hyperactivity disorder, unspecified type: Secondary | ICD-10-CM | POA: Insufficient documentation

## 2016-04-07 DIAGNOSIS — N920 Excessive and frequent menstruation with regular cycle: Secondary | ICD-10-CM | POA: Diagnosis not present

## 2016-04-07 DIAGNOSIS — R531 Weakness: Secondary | ICD-10-CM | POA: Insufficient documentation

## 2016-04-07 DIAGNOSIS — E041 Nontoxic single thyroid nodule: Secondary | ICD-10-CM | POA: Diagnosis not present

## 2016-04-07 DIAGNOSIS — R5383 Other fatigue: Secondary | ICD-10-CM | POA: Insufficient documentation

## 2016-04-07 DIAGNOSIS — Z7951 Long term (current) use of inhaled steroids: Secondary | ICD-10-CM

## 2016-04-07 DIAGNOSIS — M549 Dorsalgia, unspecified: Secondary | ICD-10-CM | POA: Diagnosis not present

## 2016-04-07 DIAGNOSIS — Z7901 Long term (current) use of anticoagulants: Secondary | ICD-10-CM | POA: Diagnosis not present

## 2016-04-07 DIAGNOSIS — Z803 Family history of malignant neoplasm of breast: Secondary | ICD-10-CM | POA: Insufficient documentation

## 2016-04-07 DIAGNOSIS — R42 Dizziness and giddiness: Secondary | ICD-10-CM | POA: Insufficient documentation

## 2016-04-07 DIAGNOSIS — Z87891 Personal history of nicotine dependence: Secondary | ICD-10-CM

## 2016-04-07 DIAGNOSIS — M7989 Other specified soft tissue disorders: Secondary | ICD-10-CM | POA: Insufficient documentation

## 2016-04-07 DIAGNOSIS — Z79899 Other long term (current) drug therapy: Secondary | ICD-10-CM | POA: Insufficient documentation

## 2016-04-07 LAB — SAMPLE TO BLOOD BANK

## 2016-04-07 LAB — CBC WITH DIFFERENTIAL/PLATELET
BASOS ABS: 0.1 10*3/uL (ref 0–0.1)
BASOS PCT: 1 %
EOS PCT: 2 %
Eosinophils Absolute: 0.2 10*3/uL (ref 0–0.7)
HEMATOCRIT: 19 % — AB (ref 35.0–47.0)
Hemoglobin: 6.5 g/dL — ABNORMAL LOW (ref 12.0–16.0)
LYMPHS PCT: 33 %
Lymphs Abs: 3.9 10*3/uL — ABNORMAL HIGH (ref 1.0–3.6)
MCH: 30.5 pg (ref 26.0–34.0)
MCHC: 33.9 g/dL (ref 32.0–36.0)
MCV: 90.1 fL (ref 80.0–100.0)
MONO ABS: 0.7 10*3/uL (ref 0.2–0.9)
MONOS PCT: 6 %
NEUTROS ABS: 7 10*3/uL — AB (ref 1.4–6.5)
Neutrophils Relative %: 58 %
PLATELETS: 325 10*3/uL (ref 150–440)
RBC: 2.11 MIL/uL — ABNORMAL LOW (ref 3.80–5.20)
RDW: 13.1 % (ref 11.5–14.5)
WBC: 11.9 10*3/uL — ABNORMAL HIGH (ref 3.6–11.0)

## 2016-04-07 LAB — PROTIME-INR
INR: 2.34
Prothrombin Time: 26.1 seconds — ABNORMAL HIGH (ref 11.4–15.2)

## 2016-04-07 LAB — APTT: aPTT: 35 seconds (ref 24–36)

## 2016-04-07 LAB — IRON AND TIBC
IRON: 269 ug/dL — AB (ref 28–170)
SATURATION RATIOS: 80 % — AB (ref 10.4–31.8)
TIBC: 337 ug/dL (ref 250–450)
UIBC: 68 ug/dL

## 2016-04-07 LAB — PREPARE RBC (CROSSMATCH)

## 2016-04-07 LAB — FERRITIN: FERRITIN: 15 ng/mL (ref 11–307)

## 2016-04-07 MED ORDER — ACETAMINOPHEN 325 MG PO TABS
ORAL_TABLET | ORAL | Status: AC
  Filled 2016-04-07: qty 2

## 2016-04-07 MED ORDER — ACETAMINOPHEN 325 MG PO TABS
650.0000 mg | ORAL_TABLET | Freq: Once | ORAL | Status: AC
  Administered 2016-04-07: 650 mg via ORAL

## 2016-04-07 MED ORDER — DIPHENHYDRAMINE HCL 50 MG/ML IJ SOLN
25.0000 mg | Freq: Once | INTRAMUSCULAR | Status: AC
  Administered 2016-04-07: 25 mg via INTRAVENOUS

## 2016-04-07 MED ORDER — SODIUM CHLORIDE 0.9 % IV SOLN
250.0000 mL | Freq: Once | INTRAVENOUS | Status: AC
  Administered 2016-04-07: 250 mL via INTRAVENOUS
  Filled 2016-04-07: qty 250

## 2016-04-07 MED ORDER — DIPHENHYDRAMINE HCL 25 MG PO CAPS
ORAL_CAPSULE | ORAL | Status: AC
  Filled 2016-04-07: qty 1

## 2016-04-07 MED ORDER — DIPHENHYDRAMINE HCL 50 MG/ML IJ SOLN
INTRAMUSCULAR | Status: AC
  Filled 2016-04-07: qty 1

## 2016-04-08 ENCOUNTER — Inpatient Hospital Stay: Payer: BLUE CROSS/BLUE SHIELD

## 2016-04-08 ENCOUNTER — Ambulatory Visit (INDEPENDENT_AMBULATORY_CARE_PROVIDER_SITE_OTHER): Payer: BLUE CROSS/BLUE SHIELD | Admitting: Obstetrics and Gynecology

## 2016-04-08 ENCOUNTER — Encounter: Payer: Self-pay | Admitting: Obstetrics and Gynecology

## 2016-04-08 VITALS — BP 103/62 | HR 114 | Ht 63.0 in | Wt 222.6 lb

## 2016-04-08 DIAGNOSIS — Z86711 Personal history of pulmonary embolism: Secondary | ICD-10-CM

## 2016-04-08 DIAGNOSIS — Z6839 Body mass index (BMI) 39.0-39.9, adult: Secondary | ICD-10-CM | POA: Diagnosis not present

## 2016-04-08 DIAGNOSIS — N920 Excessive and frequent menstruation with regular cycle: Secondary | ICD-10-CM | POA: Diagnosis not present

## 2016-04-08 DIAGNOSIS — IMO0001 Reserved for inherently not codable concepts without codable children: Secondary | ICD-10-CM

## 2016-04-08 DIAGNOSIS — E6609 Other obesity due to excess calories: Secondary | ICD-10-CM

## 2016-04-08 LAB — TYPE AND SCREEN
ABO/RH(D): A POS
Antibody Screen: NEGATIVE
UNIT DIVISION: 0
Unit division: 0

## 2016-04-08 NOTE — Progress Notes (Signed)
HPI:      [name] is a 41 y.o. [id number] who LMP was Patient's last menstrual period was 03/28/2016 (exact date).  Subjective:   She presents today For discussion of IUD placement for controlling heavy menstrual bleeding. Her history is significant for recent pulmonary embolism after lying in bed with the flu. She was begun on xarelto for anticoagulation and immediately began having profuse vaginal bleeding associated with her menstrual period. Prior to this she had normal regular cycles without issue. Her bleeding became so heavy that she needed a blood transfusion and the xarelto was stopped. Her bleeding is scant now. Her husband has a vasectomy for birth control.    Hx: The following portions of the patient's history were reviewed and updated as appropriate:             She  has a past medical history of ADHD; Back pain; and Pulmonary embolism ([place]). She  does not have any pertinent problems on file. She  has a past surgical history that includes Back surgery and Tonsillectomy. Her family history includes Breast cancer in her maternal grandmother; Diabetes in her father, maternal grandmother, and paternal grandmother; Stroke in her paternal grandmother. She  reports that she has quit smoking. She has never used smokeless tobacco. She reports that she drinks alcohol. She reports that she does not use drugs.       Review of Systems:  Review of Systems  Constitutional: Denied constitutional symptoms, night sweats, recent illness, fatigue, fever, insomnia and weight loss.  Eyes: Denied eye symptoms, eye pain, photophobia, vision change and visual disturbance.  Ears/Nose/Throat/Neck: Denied ear, nose, throat or neck symptoms, hearing loss, nasal discharge, sinus congestion and sore throat.  Cardiovascular: Denied cardiovascular symptoms, arrhythmia, chest pain/pressure, edema, exercise intolerance, orthopnea and palpitations.  Respiratory: Denied pulmonary symptoms, asthma, pleuritic  pain, productive sputum, cough, dyspnea and wheezing.  Gastrointestinal: Denied, gastro-esophageal reflux, melena, nausea and vomiting.  Genitourinary: See HPI for additional information.  Musculoskeletal: Denied musculoskeletal symptoms, stiffness, swelling, muscle weakness and myalgia.  Dermatologic: Denied dermatology symptoms, rash and scar.  Neurologic: Denied neurology symptoms, dizziness, headache, neck pain and syncope.  Psychiatric: Denied psychiatric symptoms, anxiety and depression.  Endocrine: Denied endocrine symptoms including hot flashes and night sweats.   Meds:   Current Outpatient Prescriptions on File Prior to Visit  Medication Sig Dispense Refill  . albuterol (PROVENTIL HFA;VENTOLIN HFA) 108 (90 Base) MCG/ACT inhaler Inhale 2 puffs into the lungs every 4 (four) hours as needed for wheezing or shortness of breath.    . dextroamphetamine (DEXTROSTAT) 10 MG tablet Take 10-20 mg by mouth daily as needed (attention). Patient takes as needed around 15:00     . lisdexamfetamine (VYVANSE) 70 MG capsule Take 70 mg by mouth daily.    . Rivaroxaban 15 & 20 MG TBPK Take as directed on package: Start with one 15mg  tablet by mouth twice a day with food. On Day 22, switch to one 20mg  tablet once a day with food. 51 each 0   No current facility-administered medications on file prior to visit.     Objective:     Vitals:   04/08/16 1521  BP: 103/62  Pulse: (!) 114    Assessment:    G2P2002 Patient Active Problem List   Diagnosis Date Noted  . Pulmonary embolism ([place]) 03/29/2016     1. Menorrhagia with regular cycle   2. History of pulmonary embolism   3. Class 2 obesity due to excess calories with serious  comorbidity and body mass index (BMI) of 39.0 to 39.9 in adult     Bleeding likely secondary to blood thinner. With normal regular menses prior no workup necessary for hyperplasia or uterine fibroids.   Plan:            1.  Patient desires IUD for cycle control.  Will place next week and then have a should restart the Xarelto.  2.  Plan annual examination with Pap at first IUD follow-up.    Meds ordered this encounter  Medications  . vitamin C (ASCORBIC ACID) 500 MG tablet    Sig: Take 500 mg by mouth daily.  . calcium-vitamin D (OSCAL WITH D) 500-200 MG-UNIT tablet    Sig: Take 1 tablet by mouth.        F/U  Return in about 5 days (around 04/13/2016). I spent 32 minutes with this patient of which greater than 50% was spent discussing anticoagulation, causes of heavy vaginal bleeding, treatments for vaginal bleeding. IUD use, risks benefits. Hormone replacement in the future in light of her PE.  Finis Bud, M.D. 04/08/2016 3:57 PM

## 2016-04-09 ENCOUNTER — Other Ambulatory Visit: Payer: Self-pay

## 2016-04-09 ENCOUNTER — Emergency Department: Payer: BLUE CROSS/BLUE SHIELD

## 2016-04-09 ENCOUNTER — Emergency Department
Admission: EM | Admit: 2016-04-09 | Discharge: 2016-04-09 | Disposition: A | Discharge status: Home / Self Care | Payer: BLUE CROSS/BLUE SHIELD | Attending: Emergency Medicine | Admitting: Emergency Medicine

## 2016-04-09 ENCOUNTER — Encounter: Payer: Self-pay | Admitting: *Deleted

## 2016-04-09 ENCOUNTER — Telehealth: Payer: Self-pay | Admitting: *Deleted

## 2016-04-09 DIAGNOSIS — Z79899 Other long term (current) drug therapy: Secondary | ICD-10-CM | POA: Insufficient documentation

## 2016-04-09 DIAGNOSIS — I2699 Other pulmonary embolism without acute cor pulmonale: Secondary | ICD-10-CM | POA: Diagnosis not present

## 2016-04-09 DIAGNOSIS — D509 Iron deficiency anemia, unspecified: Secondary | ICD-10-CM | POA: Diagnosis not present

## 2016-04-09 DIAGNOSIS — F909 Attention-deficit hyperactivity disorder, unspecified type: Secondary | ICD-10-CM | POA: Insufficient documentation

## 2016-04-09 DIAGNOSIS — R0600 Dyspnea, unspecified: Secondary | ICD-10-CM

## 2016-04-09 DIAGNOSIS — R0602 Shortness of breath: Secondary | ICD-10-CM | POA: Diagnosis present

## 2016-04-09 DIAGNOSIS — Z87891 Personal history of nicotine dependence: Secondary | ICD-10-CM | POA: Insufficient documentation

## 2016-04-09 LAB — CBC
HCT: 24.4 % — ABNORMAL LOW (ref 35.0–47.0)
HEMOGLOBIN: 8.5 g/dL — AB (ref 12.0–16.0)
MCH: 31.5 pg (ref 26.0–34.0)
MCHC: 35 g/dL (ref 32.0–36.0)
MCV: 90.1 fL (ref 80.0–100.0)
PLATELETS: 301 10*3/uL (ref 150–440)
RBC: 2.71 MIL/uL — AB (ref 3.80–5.20)
RDW: 13.3 % (ref 11.5–14.5)
WBC: 12.5 10*3/uL — AB (ref 3.6–11.0)

## 2016-04-09 LAB — TROPONIN I: Troponin I: <0.03 ng/mL (ref <0.03)

## 2016-04-09 LAB — COMPREHENSIVE METABOLIC PANEL
ALK PHOS: 42 U/L (ref 38–126)
ALT: 24 U/L (ref 14–54)
AST: 26 U/L (ref 15–41)
Albumin: 3.3 g/dL — ABNORMAL LOW (ref 3.5–5.0)
Anion gap: 7 (ref 5–15)
BUN: 9 mg/dL (ref 6–20)
CALCIUM: 8.8 mg/dL — AB (ref 8.9–10.3)
CHLORIDE: 107 mmol/L (ref 101–111)
CO2: 25 mmol/L (ref 22–32)
CREATININE: 0.87 mg/dL (ref 0.44–1.00)
GFR calc non Af Amer: >60 mL/min (ref >60)
Glucose, Bld: 101 mg/dL — ABNORMAL HIGH (ref 65–99)
Potassium: 3.8 mmol/L (ref 3.5–5.1)
Sodium: 139 mmol/L (ref 135–145)
Total Bilirubin: 0.4 mg/dL (ref 0.3–1.2)
Total Protein: 6 g/dL — ABNORMAL LOW (ref 6.5–8.1)

## 2016-04-09 LAB — TYPE AND SCREEN
ABO/RH(D): A POS
Antibody Screen: NEGATIVE

## 2016-04-09 MED ORDER — SODIUM CHLORIDE 0.9 % IV BOLUS (SEPSIS)
1000.0000 mL | Freq: Once | INTRAVENOUS | Status: AC
  Administered 2016-04-09: 1000 mL via INTRAVENOUS

## 2016-04-09 MED ORDER — LORAZEPAM 2 MG/ML IJ SOLN
INTRAMUSCULAR | Status: AC
  Administered 2016-04-09: 1 mg via INTRAVENOUS
  Filled 2016-04-09: qty 1

## 2016-04-09 MED ORDER — LORAZEPAM 2 MG/ML IJ SOLN
1.0000 mg | Freq: Once | INTRAMUSCULAR | Status: AC
  Administered 2016-04-09: 1 mg via INTRAVENOUS

## 2016-04-09 MED ORDER — IOPAMIDOL (ISOVUE-370) INJECTION 76%
75.0000 mL | Freq: Once | INTRAVENOUS | Status: AC | PRN
  Administered 2016-04-09: 75 mL via INTRAVENOUS

## 2016-04-09 NOTE — ED Triage Notes (Signed)
Pt ambulatory to triage.  Pt reports she is sob for 1.5 hours.  Pt dx with PE 1.5 weeks ago.  Pt was taken off xaeralto yesterday   Pt has been having vaginal bleeding but menses stopped yesterday.   No chest pain.  Pt pale.  Pt had 2 units of prbc's 2 days ago.   Pt alert

## 2016-04-09 NOTE — ED Provider Notes (Signed)
[facility] Emergency Department Provider Note    First MD Initiated Contact with Patient 04/09/16 [phone]     (approximate)  I have reviewed the triage vital signs and the nursing notes.   HISTORY  Chief Complaint Shortness of Breath   HPI Dana Lewis is a 41 y.o. female with history of recent diagnosis one and a half weeks ago of pulmonary emboli presents to the emergency department with dyspnea times one and a half hours. Patient denies any chest pain. Patient states that Xarelto was discontinued secondary to profuse vaginal bleeding. However patient states that vaginal bleeding stopped yesterday. Patient noted to be tachycardic on presentation hour satting 100% room air   Past Medical History:  Diagnosis Date  . ADHD   . Back pain   . Pulmonary embolism [facility])     Patient Active Problem List   Diagnosis Date Noted  . Pulmonary embolism ([place]) 03/29/2016    Past Surgical History:  Procedure Laterality Date  . BACK SURGERY     2016  . TONSILLECTOMY      Prior to Admission medications   Medication Sig Start Date End Date Taking? Authorizing Provider  albuterol (PROVENTIL HFA;VENTOLIN HFA) 108 (90 Base) MCG/ACT inhaler Inhale 2 puffs into the lungs every 4 (four) hours as needed for wheezing or shortness of breath.   Yes Historical Provider, MD  calcium-vitamin D (OSCAL WITH D) 500-200 MG-UNIT tablet Take 1 tablet by mouth.   Yes Historical Provider, MD  dextroamphetamine (DEXTROSTAT) 10 MG tablet Take 10-20 mg by mouth daily as needed (attention). Patient takes as needed around 15:00    Yes Historical Provider, MD  lisdexamfetamine (VYVANSE) 70 MG capsule Take 70 mg by mouth daily.   Yes Historical Provider, MD  vitamin C (ASCORBIC ACID) 500 MG tablet Take 500 mg by mouth daily.   Yes Historical Provider, MD  Rivaroxaban 15 & 20 MG TBPK Take as directed on package: Start with one 15mg  tablet by mouth twice a day with food. On Day 22, switch to one  20mg  tablet once a day with food. Patient not taking: Reported on 04/09/2016 03/30/16   Gladstone Lighter, MD    Allergies Blueberry flavor  Family History  Problem Relation Age of Onset  . Diabetes Father   . Breast cancer Maternal Grandmother   . Diabetes Maternal Grandmother   . Diabetes Paternal Grandmother   . Stroke Paternal Grandmother     Social History Social History  Substance Use Topics  . Smoking status: Former Research scientist (life sciences)  . Smokeless tobacco: Never Used     Comment: quit 8 years ago  . Alcohol use Yes     Comment: occasional wine    Review of Systems Constitutional: No fever/chills Eyes: No visual changes. ENT: No sore throat. Cardiovascular: Denies chest pain. Respiratory: Positive for shortness of breath. Gastrointestinal: No abdominal pain.  No nausea, no vomiting.  No diarrhea.  No constipation. Genitourinary: Negative for dysuria. Musculoskeletal: Negative for back pain. Skin: Negative for rash. Neurological: Negative for headaches, focal weakness or numbness.  10-point ROS otherwise negative.  ____________________________________________   PHYSICAL EXAM:  VITAL SIGNS: ED Triage Vitals  Enc Vitals Group     BP 04/09/16 0209 135/83     Pulse Rate 04/09/16 0209 (!) 120     Resp 04/09/16 0209 (!) 22     Temp 04/09/16 0209 98.7 F (37.1 C)     Temp Source 04/09/16 0209 Oral     SpO2 04/09/16 0209 100 %  Weight 04/09/16 0210 220 lb (99.8 kg)     Height 04/09/16 0210 5\' 3"  (1.6 m)     Head Circumference --      Peak Flow --      Pain Score 04/09/16 0219 0     Pain Loc --      Pain Edu? --      Excl. in [place]? --     Constitutional: Alert and oriented. Apparent dyspnea, anxious affect Eyes: Conjunctivae are normal. PERRL. EOMI. Head: Atraumatic. Mouth/Throat: Mucous membranes are moist. Oropharynx non-erythematous. Neck: No stridor.  No meningeal signs.  No cervical spine tenderness to palpation. Cardiovascular: Normal rate, regular rhythm.  Good peripheral circulation. Grossly normal heart sounds. Respiratory: Normal respiratory effort.  No retractions. Lungs CTAB. Gastrointestinal: Soft and nontender. No distention.  Musculoskeletal: No lower extremity tenderness nor edema. No gross deformities of extremities. Neurologic:  Normal speech and language. No gross focal neurologic deficits are appreciated.  Skin:  Skin is warm, dry and intact. No rash noted. Psychiatric: Very anxious affect. Speech and behavior are normal.  ____________________________________________   LABS (all labs ordered are listed, but only abnormal results are displayed)  Labs Reviewed  CBC - Abnormal; Notable for the following:       Result Value   WBC 12.5 (*)    RBC 2.71 (*)    Hemoglobin 8.5 (*)    HCT 24.4 (*)    All other components within normal limits  COMPREHENSIVE METABOLIC PANEL - Abnormal; Notable for the following:    Glucose, Bld 101 (*)    Calcium 8.8 (*)    Total Protein 6.0 (*)    Albumin 3.3 (*)    All other components within normal limits  TROPONIN I  TROPONIN I  TYPE AND SCREEN   ____________________________________________  EKG  ED ECG REPORT I, Laton N BROWN, the attending physician, personally viewed and interpreted this ECG.   Date: 04/09/2016  EKG Time: 2:17 AM  Rate: 110  Rhythm: Sinus tachycardia  Axis: Normal  Intervals: Normal  ST&T Change: None  ____________________________________________  RADIOLOGY I, Rodanthe N BROWN, personally viewed and evaluated these images (plain radiographs) as part of my medical decision making, as well as reviewing the written report by the radiologist.  Ct Angio Chest Pe W And/or Wo Contrast  Result Date: 04/09/2016 CLINICAL DATA:  Shortness of breath history of PE EXAM: CT ANGIOGRAPHY CHEST WITH CONTRAST TECHNIQUE: Multidetector CT imaging of the chest was performed using the standard protocol during bolus administration of intravenous contrast. Multiplanar CT image  reconstructions and MIPs were obtained to evaluate the vascular anatomy. CONTRAST:  75 mL Isovue 370 intravenous COMPARISON:  CT 03/29/2016 FINDINGS: Cardiovascular: Satisfactory opacification of the pulmonary arteries to the segmental level. No evidence of pulmonary embolism. Previously noted filling defect in the right lower lobe segmental artery is not identified on the current exam. Thoracic aorta is non aneurysmal. No dissection. Normal heart size. No pericardial effusion. Mediastinum/Nodes: No enlarged mediastinal, hilar, or axillary lymph nodes. Thyroid gland, trachea, and esophagus demonstrate no significant findings. Lungs/Pleura: Lungs are clear. No pleural effusion or pneumothorax. Upper Abdomen: No acute abnormality. Musculoskeletal: No chest wall abnormality. No acute or significant osseous findings. Review of the MIP images confirms the above findings. IMPRESSION: 1. The previously noted filling defect in the right lower lobe segmental artery is not identified on the current exam. No acute embolus or aortic dissection is visualized 2. Clear lung fields Electronically Signed   By: Madie Reno.D.  On: 04/09/2016 03:41      Procedures    INITIAL IMPRESSION / ASSESSMENT AND PLAN / ED COURSE  Pertinent labs & imaging results that were available during my care of the patient were reviewed by me and considered in my medical decision making (see chart for details).  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 Presents to the emergency department with acute onset of dyspnea 1-1/2 hours ago. Concern for possible worsening clot burden versus new pulmonary emboli as such CT chest PE protocol was performed which revealed no acute pulmonary embolus. Previously seen filling defect in the right lower lobe segmental artery was no longer identified. Patient very anxious on arrival to the  emergency department and as such was given 1 mg of IV Ativan with resolution of dyspnea and tachycardia. Also consider the possibility of cardiac etiology for the patient's chest pain and a such EKG and troponin 2 were performed which revealed no evidence of ischemia or infarction. Spoke with the patient and her family at length regarding necessity of following up with the hematologist today. Patient is advised to return to the emergency department immediately if chest pain or shortness of breath were to ensue      ____________________________________________  FINAL CLINICAL IMPRESSION(S) / ED DIAGNOSES  Final diagnoses:  Dyspnea, unspecified type  Other acute pulmonary embolism without acute cor pulmonale (HCC)  Iron deficiency anemia, unspecified iron deficiency anemia type     MEDICATIONS GIVEN DURING THIS VISIT:  Medications  LORazepam (ATIVAN) injection 1 mg (1 mg Intravenous Given 04/09/16 0250)  iopamidol (ISOVUE-370) 76 % injection 75 mL (75 mLs Intravenous Contrast Given 04/09/16 0318)  sodium chloride 0.9 % bolus 1,000 mL (0 mLs Intravenous Stopped 04/09/16 0446)     NEW OUTPATIENT MEDICATIONS STARTED DURING THIS VISIT:  New Prescriptions   No medications on file    Modified Medications   No medications on file    Discontinued Medications   No medications on file     Note:  This document was prepared using Dragon voice recognition software and may include unintentional dictation errors.    Gregor Hams, MD 04/09/16 [phone]

## 2016-04-09 NOTE — ED Notes (Signed)
Pt. Verbalizes understanding of d/c instructions and follow-up. VS stable and pain controlled per pt.  Pt. In NAD at time of d/c and denies further concerns regarding this visit. Pt. Stable at the time of departure from the unit, departing unit by the safest and most appropriate manner per that pt condition and limitations. Pt advised to return to the ED at any time for emergent concerns, or for new/worsening symptoms.   

## 2016-04-09 NOTE — ED Notes (Addendum)
Pt stating that she was taken off her xeralto after heavy menses and needing 2 units of PRBC yesterday. Pt stating that her cycle has stopped as of today. Pt was previously dx with a PE and that is why she was on the blood thinner. Pt is very pale with dark circles under her eyes on arrival. Pt appears to have labored breathing on arrival. Pt is stating that she began with SOB this evening. Pt has been placed on 2L [place] and Dr. Owens Shark is at bedside. Pt stating that her clot was originally in the lower right lung. Pt stating that she has been having tingling when she is trying to walk in her BLE. Pt stating pain only if she is trying to take a deep breath, but denying any at this time.

## 2016-04-09 NOTE — Telephone Encounter (Addendum)
Pt's spouse called our office to inform us that pt went to ED early this morning for worsening shortness of breath. CT scan was performed which revealed clear lung fields. Pt has appt on Monday for IUD insertion and will follow up with Dr. Grayland Ormond on 3/1. Informed pt's spouse to keep appts as scheduled and Dr. Grayland Ormond will be notified of recent ED visit.

## 2016-04-09 NOTE — ED Notes (Signed)
Pt appearing to be more uncomfortable at this time. Pt stating that she "feels on the verge of a panic attack." Dr. Owens Shark notified and medication has been ordered.

## 2016-04-12 ENCOUNTER — Emergency Department
Admission: EM | Admit: 2016-04-12 | Discharge: 2016-04-13 | Disposition: A | Discharge status: Home / Self Care | Payer: BLUE CROSS/BLUE SHIELD | Attending: Emergency Medicine | Admitting: Emergency Medicine

## 2016-04-12 ENCOUNTER — Ambulatory Visit: Payer: BLUE CROSS/BLUE SHIELD | Admitting: Obstetrics and Gynecology

## 2016-04-12 ENCOUNTER — Encounter: Payer: Self-pay | Admitting: *Deleted

## 2016-04-12 DIAGNOSIS — R0602 Shortness of breath: Secondary | ICD-10-CM | POA: Insufficient documentation

## 2016-04-12 DIAGNOSIS — M7989 Other specified soft tissue disorders: Secondary | ICD-10-CM | POA: Insufficient documentation

## 2016-04-12 DIAGNOSIS — Z79899 Other long term (current) drug therapy: Secondary | ICD-10-CM | POA: Insufficient documentation

## 2016-04-12 DIAGNOSIS — F909 Attention-deficit hyperactivity disorder, unspecified type: Secondary | ICD-10-CM | POA: Diagnosis not present

## 2016-04-12 DIAGNOSIS — Z87891 Personal history of nicotine dependence: Secondary | ICD-10-CM | POA: Diagnosis not present

## 2016-04-12 NOTE — ED Triage Notes (Signed)
Pt was seen in er last week with low hgb, and sob.  Today pt has swelling of right ankle and sob.  Pt has hx PE.  Pt off xarelto x 6 days.  Pt alert.

## 2016-04-13 ENCOUNTER — Emergency Department: Payer: BLUE CROSS/BLUE SHIELD

## 2016-04-13 ENCOUNTER — Telehealth: Payer: Self-pay | Admitting: *Deleted

## 2016-04-13 LAB — CBC
HCT: 26.6 % — ABNORMAL LOW (ref 35.0–47.0)
Hemoglobin: 9.1 g/dL — ABNORMAL LOW (ref 12.0–16.0)
MCH: 32 pg (ref 26.0–34.0)
MCHC: 34.3 g/dL (ref 32.0–36.0)
MCV: 93.3 fL (ref 80.0–100.0)
PLATELETS: 342 10*3/uL (ref 150–440)
RBC: 2.85 MIL/uL — ABNORMAL LOW (ref 3.80–5.20)
RDW: 16.3 % — ABNORMAL HIGH (ref 11.5–14.5)
WBC: 8.3 10*3/uL (ref 3.6–11.0)

## 2016-04-13 LAB — APTT: aPTT: 28 seconds (ref 24–36)

## 2016-04-13 LAB — COMPREHENSIVE METABOLIC PANEL
ALBUMIN: 3.8 g/dL (ref 3.5–5.0)
ALK PHOS: 48 U/L (ref 38–126)
ALT: 41 U/L (ref 14–54)
AST: 30 U/L (ref 15–41)
Anion gap: 6 (ref 5–15)
BUN: 13 mg/dL (ref 6–20)
CALCIUM: 8.6 mg/dL — AB (ref 8.9–10.3)
CO2: 24 mmol/L (ref 22–32)
Chloride: 107 mmol/L (ref 101–111)
Creatinine, Ser: 1.1 mg/dL — ABNORMAL HIGH (ref 0.44–1.00)
GFR calc Af Amer: >60 mL/min (ref >60)
GLUCOSE: 119 mg/dL — AB (ref 65–99)
Potassium: 3.5 mmol/L (ref 3.5–5.1)
Sodium: 137 mmol/L (ref 135–145)
TOTAL PROTEIN: 6.3 g/dL — AB (ref 6.5–8.1)
Total Bilirubin: 0.6 mg/dL (ref 0.3–1.2)

## 2016-04-13 LAB — BRAIN NATRIURETIC PEPTIDE: B NATRIURETIC PEPTIDE 5: 22 pg/mL (ref 0.0–100.0)

## 2016-04-13 LAB — PROTIME-INR
INR: 0.98
PROTHROMBIN TIME: 13 s (ref 11.4–15.2)

## 2016-04-13 NOTE — Telephone Encounter (Signed)
[place], thanks.  I think she is scheduled for IUD this week.

## 2016-04-13 NOTE — ED Notes (Signed)
Pt went to X-Ray.  

## 2016-04-13 NOTE — ED Provider Notes (Signed)
[facility] Emergency Department Provider Note   ____________________________________________   First MD Initiated Contact with Patient 04/12/16 2352     (approximate)  I have reviewed the triage vital signs and the nursing notes.   HISTORY  Chief Complaint Shortness of Breath and Leg Swelling    HPI Clarence Bartolo is a 41 y.o. female who comes into the hospital today with some right leg and foot swelling. It started a couple of days ago. The patient was recently diagnosed with a PE and had been on Xarelto. She was taken off because she developed some severe vaginal bleeding and decreased her hemoglobin and hematocrit. She reports that she receive 2 units of blood and was planning to get an IUD today and restart her Xarelto on March 1. The patient reports that she has been off of her Xarelto since February 21. She states that she could not go to the [facility] today for her IUD because her feet were swollen. She reports that she is also felt a little short of breath as well as having some wheezing. She states is not as bad as it had been but she was concerned. The patient decided to come into the hospital today for evaluation.   Past Medical History:  Diagnosis Date  . ADHD   . Back pain   . Pulmonary embolism [facility])     Patient Active Problem List   Diagnosis Date Noted  . Pulmonary embolism ([place]) 03/29/2016    Past Surgical History:  Procedure Laterality Date  . BACK SURGERY     2016  . TONSILLECTOMY      Prior to Admission medications   Medication Sig Start Date End Date Taking? Authorizing Provider  albuterol (PROVENTIL HFA;VENTOLIN HFA) 108 (90 Base) MCG/ACT inhaler Inhale 2 puffs into the lungs every 4 (four) hours as needed for wheezing or shortness of breath.   Yes Historical Provider, MD  dextroamphetamine (DEXTROSTAT) 10 MG tablet Take 10-20 mg by mouth daily as needed (attention). Patient takes as needed around 15:00    Yes Historical Provider,  MD  lisdexamfetamine (VYVANSE) 70 MG capsule Take 70 mg by mouth daily.   Yes Historical Provider, MD  vitamin C (ASCORBIC ACID) 500 MG tablet Take 500 mg by mouth daily.   Yes Historical Provider, MD  Rivaroxaban 15 & 20 MG TBPK Take as directed on package: Start with one 15mg  tablet by mouth twice a day with food. On Day 22, switch to one 20mg  tablet once a day with food. Patient not taking: Reported on 04/09/2016 03/30/16   Gladstone Lighter, MD    Allergies Blueberry flavor  Family History  Problem Relation Age of Onset  . Diabetes Father   . Breast cancer Maternal Grandmother   . Diabetes Maternal Grandmother   . Diabetes Paternal Grandmother   . Stroke Paternal Grandmother     Social History Social History  Substance Use Topics  . Smoking status: Former Research scientist (life sciences)  . Smokeless tobacco: Never Used     Comment: quit 8 years ago  . Alcohol use Yes     Comment: occasional wine    Review of Systems Constitutional: No fever/chills Eyes: No visual changes. ENT: No sore throat. Cardiovascular: Denies chest pain. Respiratory: shortness of breath. Gastrointestinal: No abdominal pain.  No nausea, no vomiting.  No diarrhea.  No constipation. Genitourinary: Negative for dysuria. Musculoskeletal: Negative for back pain. Skin: Negative for rash. Neurological: Negative for headaches, focal weakness or numbness. Lymph: right leg and ankle  swelling  10-point ROS otherwise negative.  ____________________________________________   PHYSICAL EXAM:  VITAL SIGNS: ED Triage Vitals  Enc Vitals Group     BP 04/12/16 2344 119/79     Pulse Rate 04/12/16 2344 (!) 105     Resp 04/12/16 2344 20     Temp 04/12/16 2344 99.2 F (37.3 C)     Temp Source 04/12/16 2344 Oral     SpO2 04/12/16 2344 100 %     Weight 04/12/16 2345 220 lb (99.8 kg)     Height 04/12/16 2345 5\' 3"  (1.6 m)     Head Circumference --      Peak Flow --      Pain Score 04/12/16 2346 2     Pain Loc --      Pain Edu? --       Excl. in [place]? --     Constitutional: Alert and oriented. Well appearing and in mild distress. Eyes: Conjunctivae are normal. PERRL. EOMI. Head: Atraumatic. Nose: No congestion/rhinnorhea. Mouth/Throat: Mucous membranes are moist.  Oropharynx non-erythematous. Cardiovascular: Normal rate, regular rhythm. Grossly normal heart sounds.  Good peripheral circulation. Respiratory: Normal respiratory effort.  No retractions. Lungs CTAB. Gastrointestinal: Soft and nontender. No distention. Positive bowel sounds Musculoskeletal: mild right lower extremity edema   Neurologic:  Normal speech and language.  Skin:  Skin is warm, dry and intact. Psychiatric: Mood and affect are normal.   ____________________________________________   LABS (all labs ordered are listed, but only abnormal results are displayed)  Labs Reviewed  CBC - Abnormal; Notable for the following:       Result Value   RBC 2.85 (*)    Hemoglobin 9.1 (*)    HCT 26.6 (*)    RDW 16.3 (*)    All other components within normal limits  COMPREHENSIVE METABOLIC PANEL - Abnormal; Notable for the following:    Glucose, Bld 119 (*)    Creatinine, Ser 1.10 (*)    Calcium 8.6 (*)    Total Protein 6.3 (*)    All other components within normal limits  PROTIME-INR  APTT  BRAIN NATRIURETIC PEPTIDE   ____________________________________________  EKG  none ____________________________________________  RADIOLOGY  CXR RLE doppler ultrasounds ____________________________________________   PROCEDURES  Procedure(s) performed: None  Procedures  Critical Care performed: No  ____________________________________________   INITIAL IMPRESSION / ASSESSMENT AND PLAN / ED COURSE  Pertinent labs & imaging results that were available during my care of the patient were reviewed by me and considered in my medical decision making (see chart for details).  This is a 41 year old female who comes into the hospital today with some  right lower extremity swelling and some mild shortness of breath. The patient has been diagnosed with a PE recently and had been on Xarelto. The patient developed some significant bleeding and has now been off of her Xarelto. I did send the patient for an ultrasound as well as check some blood work. The patient's ultrasound does not show a DVT. I also did a chest x-ray which is negative. I will check a BNP and if it is all unremarkable I will discharge the patient to follow back up with her primary care physician as well as her hematologist and over joint.  The patient's ultrasound does not show a DVT in her chest x-ray is unremarkable. I explained to the patient that while it is important that she restart her Xarelto she does need to follow back up with her GYN physician to obtain her IUD.  I have instructed her to contact her hematologist as well as her OB/GYN. The patient will be discharged to home. It was discussed that she wear some compression stockings as well as elevate her legs whenever possible to help with some of the swelling. If it persists she should return for further evaluation.  Clinical Course as of Apr 13 299  Tue Apr 13, 2016  0110 No evidence of RIGHT lower extremity deep venous thrombosis. US Venous Img Lower Unilateral Right [AW]  0122 Normal chest. DG Chest 2 View [AW]    Clinical Course User Index [AW] Loney Hering, MD     ____________________________________________   FINAL CLINICAL IMPRESSION(S) / ED DIAGNOSES  Final diagnoses:  Leg swelling      NEW MEDICATIONS STARTED DURING THIS VISIT:  New Prescriptions   No medications on file     Note:  This document was prepared using Dragon voice recognition software and may include unintentional dictation errors.    Loney Hering, MD 04/13/16 [id number]

## 2016-04-13 NOTE — ED Notes (Signed)
Pt returned from X-ray.  

## 2016-04-13 NOTE — ED Notes (Signed)
Pt left to ultrasound

## 2016-04-13 NOTE — Discharge Instructions (Signed)
Please follow-up with your hematologist as well as her OB/GYN to obtain your IUD and then restart your Xarelto. Please use compression stockings as well as elevate your legs whenever you're not walking around. Please return to the hospital with any other concerns.

## 2016-04-13 NOTE — Telephone Encounter (Signed)
Called to report that she was taken off the Xarelto due to having bleeding. She went to ER last night because her right leg swelled up, but they did not find a clot. She had a large diarhea black stool this morning and is asking what to do. I advised she return to ER for evaluation. She agreed to go

## 2016-04-14 ENCOUNTER — Ambulatory Visit (INDEPENDENT_AMBULATORY_CARE_PROVIDER_SITE_OTHER): Payer: BLUE CROSS/BLUE SHIELD | Admitting: Obstetrics and Gynecology

## 2016-04-14 ENCOUNTER — Encounter: Payer: Self-pay | Admitting: Obstetrics and Gynecology

## 2016-04-14 VITALS — BP 129/83 | HR 118 | Ht 63.0 in | Wt 220.0 lb

## 2016-04-14 DIAGNOSIS — N920 Excessive and frequent menstruation with regular cycle: Secondary | ICD-10-CM | POA: Diagnosis not present

## 2016-04-14 DIAGNOSIS — Z86711 Personal history of pulmonary embolism: Secondary | ICD-10-CM

## 2016-04-14 DIAGNOSIS — Z30430 Encounter for insertion of intrauterine contraceptive device: Secondary | ICD-10-CM

## 2016-04-14 NOTE — Progress Notes (Signed)
[place]  [phone] [phone]  ID: Dana Lewis OB: [birth year]  MR#: [id number]  [id number]  Patient Care Team: Barbaraann Boys, MD as PCP - General (Pediatrics)  CHIEF COMPLAINT: Pulmonary embolism  INTERVAL HISTORY: Patient returns to clinic today for further evaluation and to discuss whether to reinitiate anticoagulation. The interim she has had minimal bleeding, but was evaluated in emergency room for left leg pain and swelling as well as shortness of breath. Repeat CT scan of the chest did not reveal any pulmonary embolism and lower extremity Dopplers did not reveal DVT.  She has no neurologic complaints. She denies any fevers. She has good appetite and denies weight loss. She has no further chest pain or shortness of breath. She denies any hemoptysis or cough. She has no nausea, vomiting, constipation, or diarrhea. She has no melena or hematochezia. She has no urinary complaints. Patient otherwise feels well and offers no further specific complaints.  REVIEW OF SYSTEMS:   Review of Systems  Constitutional: Positive for malaise/fatigue. Negative for fever and weight loss.  Respiratory: Positive for shortness of breath. Negative for cough and hemoptysis.   Cardiovascular: Negative.  Negative for chest pain and leg swelling.  Gastrointestinal: Negative.  Negative for abdominal pain, blood in stool and melena.  Musculoskeletal: Negative.   Neurological: Positive for weakness. Negative for dizziness.  Psychiatric/Behavioral: The patient is nervous/anxious.     As per HPI. Otherwise, a complete review of systems is negative.  PAST MEDICAL HISTORY: Past Medical History:  Diagnosis Date  . ADHD   . Back pain   . Pulmonary embolism ([place])     PAST SURGICAL HISTORY: Past Surgical History:  Procedure Laterality Date  . BACK SURGERY     2016  . TONSILLECTOMY      FAMILY HISTORY: Family History  Problem Relation Age of Onset  .  Diabetes Father   . Breast cancer Maternal Grandmother   . Diabetes Maternal Grandmother   . Diabetes Paternal Grandmother   . Stroke Paternal Grandmother     ADVANCED DIRECTIVES (Y/N):  N  HEALTH MAINTENANCE: Social History  Substance Use Topics  . Smoking status: Former Research scientist (life sciences)  . Smokeless tobacco: Never Used     Comment: quit 8 years ago  . Alcohol use Yes     Comment: occasional wine     Colonoscopy:  PAP:  Bone density:  Lipid panel:  Allergies  Allergen Reactions  . Blueberry Flavor Anaphylaxis  . Cat Hair Extract Dermatitis, Itching, Shortness Of Breath and Swelling  . [employer] and Swelling    Current Outpatient Prescriptions  Medication Sig Dispense Refill  . albuterol (PROVENTIL HFA;VENTOLIN HFA) 108 (90 Base) MCG/ACT inhaler Inhale 2 puffs into the lungs every 4 (four) hours as needed for wheezing or shortness of breath.    . busPIRone (BUSPAR) 10 MG tablet Take 10 mg by mouth continuous as needed.    Marland Kitchen dextroamphetamine (DEXTROSTAT) 10 MG tablet   0  . ferrous sulfate 325 (65 FE) MG EC tablet Take 325 mg by mouth 3 (three) times daily with meals.    Marland Kitchen lisdexamfetamine (VYVANSE) 70 MG capsule Take 70 mg by mouth daily.    . vitamin C (ASCORBIC ACID) 500 MG tablet Take 500 mg by mouth daily.    . Magnesium Oxide 200 MG TABS Take 200 mg by mouth continuous as needed.    . Rivaroxaban (XARELTO) 15 MG TABS tablet Take 1 tablet (15 mg total) by mouth 2 (two)  times daily with a meal. 28 tablet 0   No current facility-administered medications for this visit.     OBJECTIVE: Vitals:   04/15/16 0937  BP: 113/82  Pulse: (!) 105  Resp: 18  Temp: 98.6 F (37 C)     Body mass index is 38.74 kg/m.    ECOG FS:0 - Asymptomatic  General: Well-developed, well-nourished, no acute distress. Eyes: Pink conjunctiva, anicteric sclera. HEENT: Normocephalic, moist mucous membranes, clear oropharnyx. Lungs: Clear to auscultation bilaterally. Heart: Regular rate and  rhythm. No rubs, murmurs, or gallops. Abdomen: Soft, nontender, nondistended. No organomegaly noted, normoactive bowel sounds. Musculoskeletal: No edema, cyanosis, or clubbing. Neuro: Alert, answering all questions appropriately. Cranial nerves grossly intact. Skin: No rashes or petechiae noted. Psych: Normal affect. Lymphatics: No cervical, calvicular, axillary or inguinal LAD.   LAB RESULTS:  Lab Results  Component Value Date   NA 137 04/13/2016   K 3.5 04/13/2016   CL 107 04/13/2016   CO2 24 04/13/2016   GLUCOSE 119 (H) 04/13/2016   BUN 13 04/13/2016   CREATININE 1.10 (H) 04/13/2016   CALCIUM 8.6 (L) 04/13/2016   PROT 6.3 (L) 04/13/2016   ALBUMIN 3.8 04/13/2016   AST 30 04/13/2016   ALT 41 04/13/2016   ALKPHOS 48 04/13/2016   BILITOT 0.6 04/13/2016   GFRNONAA >60 04/13/2016   GFRAA >60 04/13/2016    Lab Results  Component Value Date   WBC 6.1 04/15/2016   NEUTROABS 3.0 04/15/2016   HGB 10.1 (L) 04/15/2016   HCT 29.4 (L) 04/15/2016   MCV 93.0 04/15/2016   PLT 392 04/15/2016     STUDIES: Dg Chest 2 View  Result Date: 04/13/2016 CLINICAL DATA:  Shortness of breath, RIGHT ankle swelling. History of pulmonary embolism. EXAM: CHEST  2 VIEW COMPARISON:  Chest radiograph March 29, 2016 and CT chest April 09, 2016 FINDINGS: Cardiomediastinal silhouette is normal. No pleural effusions or focal consolidations. Trachea projects midline and there is no pneumothorax. Soft tissue planes and included osseous structures are non-suspicious. IMPRESSION: Normal chest. Electronically Signed   By: Elon Alas M.D.   On: 04/13/2016 01:12   Dg Chest 2 View  Result Date: 03/29/2016 CLINICAL DATA:  41 y/o  F; shortness of breath. EXAM: CHEST  2 VIEW COMPARISON:  03/20/2016 chest radiograph FINDINGS: Stable heart size and mediastinal contours are within normal limits. Both lungs are clear. The visualized skeletal structures are unremarkable. IMPRESSION: No active cardiopulmonary  disease. Electronically Signed   By: Kristine Garbe M.D.   On: 03/29/2016 03:48   Dg Chest 2 View  Result Date: 03/20/2016 CLINICAL DATA:  Cough, shortness of breath. EXAM: CHEST  2 VIEW COMPARISON:  None. FINDINGS: The heart size and mediastinal contours are within normal limits. Both lungs are clear. No pneumothorax or pleural effusion is noted. The visualized skeletal structures are unremarkable. IMPRESSION: No active cardiopulmonary disease. Electronically Signed   By: Marijo Conception, M.D.   On: 03/20/2016 16:22   Ct Angio Chest Pe W And/or Wo Contrast  Result Date: 04/09/2016 CLINICAL DATA:  Shortness of breath history of PE EXAM: CT ANGIOGRAPHY CHEST WITH CONTRAST TECHNIQUE: Multidetector CT imaging of the chest was performed using the standard protocol during bolus administration of intravenous contrast. Multiplanar CT image reconstructions and MIPs were obtained to evaluate the vascular anatomy. CONTRAST:  75 mL Isovue 370 intravenous COMPARISON:  CT 03/29/2016 FINDINGS: Cardiovascular: Satisfactory opacification of the pulmonary arteries to the segmental level. No evidence of pulmonary embolism. Previously noted filling defect  in the right lower lobe segmental artery is not identified on the current exam. Thoracic aorta is non aneurysmal. No dissection. Normal heart size. No pericardial effusion. Mediastinum/Nodes: No enlarged mediastinal, hilar, or axillary lymph nodes. Thyroid gland, trachea, and esophagus demonstrate no significant findings. Lungs/Pleura: Lungs are clear. No pleural effusion or pneumothorax. Upper Abdomen: No acute abnormality. Musculoskeletal: No chest wall abnormality. No acute or significant osseous findings. Review of the MIP images confirms the above findings. IMPRESSION: 1. The previously noted filling defect in the right lower lobe segmental artery is not identified on the current exam. No acute embolus or aortic dissection is visualized 2. Clear lung fields  Electronically Signed   By: Donavan Foil M.D.   On: 04/09/2016 03:41   Ct Angio Chest Pe W And/or Wo Contrast  Result Date: 03/29/2016 CLINICAL DATA:  41 y/o  F; shortness of breath and chest pain. EXAM: CT ANGIOGRAPHY CHEST WITH CONTRAST TECHNIQUE: Multidetector CT imaging of the chest was performed using the standard protocol during bolus administration of intravenous contrast. Multiplanar CT image reconstructions and MIPs were obtained to evaluate the vascular anatomy. CONTRAST:  75 cc Isovue 370 COMPARISON:  03/29/2016 chest radiograph FINDINGS: Cardiovascular: Satisfactory opacification of the pulmonary arteries to the segmental level. Filling defect within right lower lobe posterior basal segmental pulmonary artery consistent with pulmonary embolus (series 6, image 117). No evidence for right heart strain. RV/LV index =0.8. Normal heart size. No pericardial effusion. Normal caliber thoracic aorta with normal variant bovine arch. Mediastinum/Nodes: 12 mm nodule in the right lobe of the thyroid. Normal trachea. Normal mediastinal esophagus. Lungs/Pleura: Lungs are clear. No pleural effusion or pneumothorax. Upper Abdomen: No acute abnormality. Musculoskeletal: No chest wall abnormality. No acute or significant osseous findings. Review of the MIP images confirms the above findings. IMPRESSION: Positive for segmental pulmonary embolus in the right posterior lower lobe. No evidence for right heart strain. These results were called by telephone at the time of interpretation on 03/29/2016 at 4:53 am to Dr. Charlesetta Ivory , who verbally acknowledged these results. Electronically Signed   By: Kristine Garbe M.D.   On: 03/29/2016 04:54   US Transvaginal Non-ob  Result Date: 04/04/2016 CLINICAL DATA:  Initial evaluation for heavy menstrual bleeding, on Xarelto. EXAM: TRANSABDOMINAL AND TRANSVAGINAL ULTRASOUND OF PELVIS TECHNIQUE: Both transabdominal and transvaginal ultrasound examinations of the  pelvis were performed. Transabdominal technique was performed for global imaging of the pelvis including uterus, ovaries, adnexal regions, and pelvic cul-de-sac. It was necessary to proceed with endovaginal exam following the transabdominal exam to visualize the uterus and ovaries. COMPARISON:  None available FINDINGS: Uterus Measurements: 10.2 x 3.5 x 5.5 cm. No fibroids or other mass visualized. Endometrium Thickness: 10 mm.  No focal abnormality visualized. Right ovary Measurements: 3.2 x 1.4 x 2.5 cm. Normal appearance/no adnexal mass. Left ovary Measurements: 3.0 x 2.0 x 1.6 cm. Normal appearance/no adnexal mass. Other findings No abnormal free fluid. IMPRESSION: Negative pelvic ultrasound.  No acute abnormality identified. Electronically Signed   By: Jeannine Boga M.D.   On: 04/04/2016 06:07   US Pelvis Complete  Result Date: 04/04/2016 CLINICAL DATA:  Initial evaluation for heavy menstrual bleeding, on Xarelto. EXAM: TRANSABDOMINAL AND TRANSVAGINAL ULTRASOUND OF PELVIS TECHNIQUE: Both transabdominal and transvaginal ultrasound examinations of the pelvis were performed. Transabdominal technique was performed for global imaging of the pelvis including uterus, ovaries, adnexal regions, and pelvic cul-de-sac. It was necessary to proceed with endovaginal exam following the transabdominal exam to visualize the uterus and ovaries.  COMPARISON:  None available FINDINGS: Uterus Measurements: 10.2 x 3.5 x 5.5 cm. No fibroids or other mass visualized. Endometrium Thickness: 10 mm.  No focal abnormality visualized. Right ovary Measurements: 3.2 x 1.4 x 2.5 cm. Normal appearance/no adnexal mass. Left ovary Measurements: 3.0 x 2.0 x 1.6 cm. Normal appearance/no adnexal mass. Other findings No abnormal free fluid. IMPRESSION: Negative pelvic ultrasound.  No acute abnormality identified. Electronically Signed   By: Jeannine Boga M.D.   On: 04/04/2016 06:07   US Venous Img Lower Bilateral  Result Date:  03/29/2016 CLINICAL DATA:  Lower extremity swelling. EXAM: BILATERAL LOWER EXTREMITY VENOUS DOPPLER ULTRASOUND TECHNIQUE: Gray-scale sonography with graded compression, as well as color Doppler and duplex ultrasound were performed to evaluate the lower extremity deep venous systems from the level of the common femoral vein and including the common femoral, femoral, profunda femoral, popliteal and calf veins including the posterior tibial, peroneal and gastrocnemius veins when visible. The superficial great saphenous vein was also interrogated. Spectral Doppler was utilized to evaluate flow at rest and with distal augmentation maneuvers in the common femoral, femoral and popliteal veins. COMPARISON:  None. FINDINGS: RIGHT LOWER EXTREMITY Common Femoral Vein: No evidence of thrombus. Normal compressibility, respiratory phasicity and response to augmentation. Saphenofemoral Junction: No evidence of thrombus. Normal compressibility and flow on color Doppler imaging. Profunda Femoral Vein: No evidence of thrombus. Normal compressibility and flow on color Doppler imaging. Femoral Vein: No evidence of thrombus. Normal compressibility, respiratory phasicity and response to augmentation. Popliteal Vein: No evidence of thrombus. Normal compressibility, respiratory phasicity and response to augmentation. Calf Veins: Visualized right deep calf veins are patent without thrombus. LEFT LOWER EXTREMITY Common Femoral Vein: No evidence of thrombus. Normal compressibility, respiratory phasicity and response to augmentation. Saphenofemoral Junction: No evidence of thrombus. Normal compressibility and flow on color Doppler imaging. Profunda Femoral Vein: No evidence of thrombus. Normal compressibility and flow on color Doppler imaging. Femoral Vein: No evidence of thrombus. Normal compressibility, respiratory phasicity and response to augmentation. Popliteal Vein: No evidence of thrombus. Normal compressibility, respiratory phasicity  and response to augmentation. Calf Veins: Visualized left deep calf veins are patent without thrombus. IMPRESSION: No evidence of deep venous thrombosis in the lower extremities. Electronically Signed   By: Markus Daft M.D.   On: 03/29/2016 10:50   US Venous Img Lower Unilateral Right  Result Date: 04/13/2016 CLINICAL DATA:  RIGHT ankle swelling for 2-3 days. History of pulmonary embolism, off Xarelto. EXAM: RIGHT LOWER EXTREMITY VENOUS DOPPLER ULTRASOUND TECHNIQUE: Gray-scale sonography with graded compression, as well as color Doppler and duplex ultrasound were performed to evaluate the lower extremity deep venous systems from the level of the common femoral vein and including the common femoral, femoral, profunda femoral, popliteal and calf veins including the posterior tibial, peroneal and gastrocnemius veins when visible. The superficial great saphenous vein was also interrogated. Spectral Doppler was utilized to evaluate flow at rest and with distal augmentation maneuvers in the common femoral, femoral and popliteal veins. COMPARISON:  None. FINDINGS: Contralateral Common Femoral Vein: Respiratory phasicity is normal and symmetric with the symptomatic side. No evidence of thrombus. Normal compressibility. Common Femoral Vein: No evidence of thrombus. Normal compressibility, respiratory phasicity and response to augmentation. Saphenofemoral Junction: No evidence of thrombus. Normal compressibility and flow on color Doppler imaging. Profunda Femoral Vein: No evidence of thrombus. Normal compressibility and flow on color Doppler imaging. Femoral Vein: No evidence of thrombus. Normal compressibility, respiratory phasicity and response to augmentation. Popliteal Vein: No evidence of thrombus.  Normal compressibility, respiratory phasicity and response to augmentation. Calf Veins: No evidence of thrombus. Normal compressibility and flow on color Doppler imaging. Venous Reflux:  None. Other Findings:  None.  IMPRESSION: No evidence of RIGHT lower extremity deep venous thrombosis. Electronically Signed   By: Elon Alas M.D.   On: 04/13/2016 01:02    ASSESSMENT: Pulmonary embolism.  PLAN:    1. Pulmonary embolism:  Patient noted to have a right lower lobe pulmonary embolism, likely secondary to transient risk factor of being in bed 4-5 days with the flu. Patient's hypercoagulable workup completed in the hospital was essentially negative or within normal limits. Patient will require 3 months of Xarelto and was instructed to reinitiate treatment today. 2. Anemia: Secondary to heavy menstrual bleeding after being placed on Xarelto. Her bleeding has subsided and her hemoglobin is now greater than 10.0. She has been instructed to continue oral iron supplementation. No further intervention is needed. Return to clinic in 3 months with repeat laboratory work and further evaluation. 3. Menstrual bleeding: Appreciate OB/GYN input. Patient had Mirena placed yesterday.  Approximately 30 minutes was spent in discussion of which greater than 50% was consultation.  Patient expressed understanding and was in agreement with this plan. She also understands that She can call clinic at any time with any questions, concerns, or complaints.    Lloyd Huger, MD   04/19/2016 11:39 AM

## 2016-04-14 NOTE — Progress Notes (Signed)
HPI:      [name] is a 41 y.o. [id number] who LMP was Patient's last menstrual period was 03/29/2016.  Subjective:   She presents today For IUD insertion for menorrhagia. She has a history of recent pulmonary embolism and has been taking xarelto. This has resulted in severe menorrhagia requiring transfusion with her last menses. The IUD will most likely control her periods while on the xarelto.    Hx: The following portions of the patient's history were reviewed and updated as appropriate:              She  has a past medical history of ADHD; Back pain; and Pulmonary embolism ([place]). She  does not have any pertinent problems on file. She  has a past surgical history that includes Back surgery and Tonsillectomy. Current Outpatient Prescriptions on File Prior to Visit  Medication Sig Dispense Refill  . albuterol (PROVENTIL HFA;VENTOLIN HFA) 108 (90 Base) MCG/ACT inhaler Inhale 2 puffs into the lungs every 4 (four) hours as needed for wheezing or shortness of breath.    . lisdexamfetamine (VYVANSE) 70 MG capsule Take 70 mg by mouth daily.    . vitamin C (ASCORBIC ACID) 500 MG tablet Take 500 mg by mouth daily.     No current facility-administered medications on file prior to visit.          Review of Systems:  Review of Systems  Constitutional: Denied constitutional symptoms, night sweats, recent illness, fatigue, fever, insomnia and weight loss.  Eyes: Denied eye symptoms, eye pain, photophobia, vision change and visual disturbance.  Ears/Nose/Throat/Neck: Denied ear, nose, throat or neck symptoms, hearing loss, nasal discharge, sinus congestion and sore throat.  Cardiovascular: Denied cardiovascular symptoms, arrhythmia, chest pain/pressure, edema, exercise intolerance, orthopnea and palpitations.  Respiratory: Denied pulmonary symptoms, asthma, pleuritic pain, productive sputum, cough, dyspnea and wheezing.  Gastrointestinal: Denied, gastro-esophageal reflux, melena, nausea and  vomiting.  Genitourinary: Denied genitourinary symptoms including symptomatic vaginal discharge, pelvic relaxation issues, and urinary complaints.  Musculoskeletal: Denied musculoskeletal symptoms, stiffness, swelling, muscle weakness and myalgia.  Dermatologic: Denied dermatology symptoms, rash and scar.  Neurologic: Denied neurology symptoms, dizziness, headache, neck pain and syncope.  Psychiatric: Denied psychiatric symptoms, anxiety and depression.  Endocrine: Denied endocrine symptoms including hot flashes and night sweats.   Meds:   Current Outpatient Prescriptions on File Prior to Visit  Medication Sig Dispense Refill  . albuterol (PROVENTIL HFA;VENTOLIN HFA) 108 (90 Base) MCG/ACT inhaler Inhale 2 puffs into the lungs every 4 (four) hours as needed for wheezing or shortness of breath.    . lisdexamfetamine (VYVANSE) 70 MG capsule Take 70 mg by mouth daily.    . vitamin C (ASCORBIC ACID) 500 MG tablet Take 500 mg by mouth daily.     No current facility-administered medications on file prior to visit.     Objective:     Vitals:   04/14/16 1342  BP: 129/83  Pulse: (!) 118              Physical examination   Pelvic:   Vulva: Normal appearance.  No lesions.  Vagina: No lesions or abnormalities noted.  Support: Normal pelvic support.  Urethra No masses tenderness or scarring.  Meatus Normal size without lesions or prolapse.  Cervix: Normal appearance.  No lesions.  Anus: Normal exam.  No lesions.  Perineum: Normal exam.  No lesions.        Bimanual   Uterus: Normal size.  Non-tender.  Mobile.  AV.  Adnexae: No  masses.  Non-tender to palpation.  Cul-de-sac: Negative for abnormality.   IUD Procedure Pt has read the booklet and signed the appropriate forms regarding the Mirena IUD.  All of her questions have been answered.   The cervix was cleansed with betadine solution.  After sounding the uterus and noting the position, the IUD was placed in the usual manner without  problem.  The string was cut to the appropriate length.  The patient tolerated the procedure well.    Assessment:    [id number] Patient Active Problem List   Diagnosis Date Noted  . History of pulmonary embolism 04/14/2016  . Pulmonary embolism ([place]) 03/29/2016     1. Encounter for insertion of mirena IUD   2. Menorrhagia with regular cycle   3. History of pulmonary embolism      Plan:             F/U  Return in about 4 weeks (around 05/12/2016).  IUD check and AE  Finis Bud, M.D. 04/14/2016 2:52 PM

## 2016-04-15 ENCOUNTER — Inpatient Hospital Stay: Payer: BLUE CROSS/BLUE SHIELD

## 2016-04-15 ENCOUNTER — Inpatient Hospital Stay: Payer: BLUE CROSS/BLUE SHIELD | Attending: Oncology | Admitting: Oncology

## 2016-04-15 VITALS — BP 113/82 | HR 105 | Temp 98.6°F | Resp 18 | Wt 218.7 lb

## 2016-04-15 DIAGNOSIS — R0602 Shortness of breath: Secondary | ICD-10-CM | POA: Diagnosis not present

## 2016-04-15 DIAGNOSIS — F419 Anxiety disorder, unspecified: Secondary | ICD-10-CM | POA: Diagnosis not present

## 2016-04-15 DIAGNOSIS — I2699 Other pulmonary embolism without acute cor pulmonale: Secondary | ICD-10-CM

## 2016-04-15 DIAGNOSIS — D649 Anemia, unspecified: Secondary | ICD-10-CM

## 2016-04-15 DIAGNOSIS — R5383 Other fatigue: Secondary | ICD-10-CM | POA: Diagnosis not present

## 2016-04-15 DIAGNOSIS — F909 Attention-deficit hyperactivity disorder, unspecified type: Secondary | ICD-10-CM | POA: Diagnosis not present

## 2016-04-15 DIAGNOSIS — Z803 Family history of malignant neoplasm of breast: Secondary | ICD-10-CM | POA: Diagnosis not present

## 2016-04-15 DIAGNOSIS — Z87891 Personal history of nicotine dependence: Secondary | ICD-10-CM

## 2016-04-15 DIAGNOSIS — M549 Dorsalgia, unspecified: Secondary | ICD-10-CM

## 2016-04-15 DIAGNOSIS — Z79899 Other long term (current) drug therapy: Secondary | ICD-10-CM | POA: Diagnosis not present

## 2016-04-15 DIAGNOSIS — R531 Weakness: Secondary | ICD-10-CM | POA: Diagnosis not present

## 2016-04-15 DIAGNOSIS — N920 Excessive and frequent menstruation with regular cycle: Secondary | ICD-10-CM | POA: Diagnosis not present

## 2016-04-15 LAB — CBC WITH DIFFERENTIAL/PLATELET
Basophils Absolute: 0.1 10*3/uL (ref 0–0.1)
Basophils Relative: 1 %
Eosinophils Absolute: 0.2 10*3/uL (ref 0–0.7)
Eosinophils Relative: 4 %
HEMATOCRIT: 29.4 % — AB (ref 35.0–47.0)
HEMOGLOBIN: 10.1 g/dL — AB (ref 12.0–16.0)
LYMPHS ABS: 2.4 10*3/uL (ref 1.0–3.6)
LYMPHS PCT: 40 %
MCH: 32.1 pg (ref 26.0–34.0)
MCHC: 34.5 g/dL (ref 32.0–36.0)
MCV: 93 fL (ref 80.0–100.0)
MONOS PCT: 6 %
Monocytes Absolute: 0.4 10*3/uL (ref 0.2–0.9)
NEUTROS ABS: 3 10*3/uL (ref 1.4–6.5)
NEUTROS PCT: 49 %
Platelets: 392 10*3/uL (ref 150–440)
RBC: 3.17 MIL/uL — ABNORMAL LOW (ref 3.80–5.20)
RDW: 17.3 % — ABNORMAL HIGH (ref 11.5–14.5)
WBC: 6.1 10*3/uL (ref 3.6–11.0)

## 2016-04-15 LAB — SAMPLE TO BLOOD BANK

## 2016-04-15 MED ORDER — RIVAROXABAN 15 MG PO TABS: 15.0000 mg | ORAL_TABLET | Freq: Two times a day (BID) | ORAL | 0 refills | Status: DC

## 2016-04-15 NOTE — Progress Notes (Signed)
No blood today per Dr. Finnegan. LJ 

## 2016-04-15 NOTE — Progress Notes (Signed)
Patient states she finds it hard to take a deep breath.  Feels fatigued and SOB.  Wants to discuss amount of iron she needs to take daily. States ankles are swollen today.  States she went to [facility] yesterday and had Mirena placed.

## 2016-05-10 ENCOUNTER — Other Ambulatory Visit: Payer: Self-pay | Admitting: Obstetrics and Gynecology

## 2016-05-10 ENCOUNTER — Ambulatory Visit (INDEPENDENT_AMBULATORY_CARE_PROVIDER_SITE_OTHER): Payer: BLUE CROSS/BLUE SHIELD | Admitting: Obstetrics and Gynecology

## 2016-05-10 ENCOUNTER — Encounter: Payer: Self-pay | Admitting: Obstetrics and Gynecology

## 2016-05-10 VITALS — BP 118/78 | HR 103 | Wt 219.1 lb

## 2016-05-10 DIAGNOSIS — Z1231 Encounter for screening mammogram for malignant neoplasm of breast: Secondary | ICD-10-CM

## 2016-05-10 DIAGNOSIS — Z01419 Encounter for gynecological examination (general) (routine) without abnormal findings: Secondary | ICD-10-CM

## 2016-05-10 DIAGNOSIS — Z1239 Encounter for other screening for malignant neoplasm of breast: Secondary | ICD-10-CM

## 2016-05-10 NOTE — Progress Notes (Signed)
HPI:      [name] is a 41 y.o. [id number] who LMP was Dana Lewis's last menstrual period was 04/21/2016 (exact date).  Subjective:   She presents today for her annual examination.  She had an IUD placed 1 month ago and has had some spotting but limited bleeding. She remains on Xarelto for recent PE-she will have to stay on it for 3 more months.  She states that she gets her routine medical lab work done at her primary physician's office.    Hx: The following portions of the Dana Lewis's history were reviewed and updated as appropriate:              She  has a past medical history of ADHD; Back pain; and Pulmonary embolism (Dana Lewis). She  does not have any pertinent problems on file. She  has a past surgical history that includes Back surgery and Tonsillectomy. Her family history includes Breast cancer in her maternal grandmother; Diabetes in her father, maternal grandmother, and paternal grandmother; Stroke in her paternal grandmother. She  reports that she has quit smoking. She has never used smokeless tobacco. She reports that she drinks alcohol. She reports that she does not use drugs. Current Outpatient Prescriptions on File Prior to Visit  Medication Sig Dispense Refill  . albuterol (PROVENTIL HFA;VENTOLIN HFA) 108 (90 Base) MCG/ACT inhaler Inhale 2 puffs into the lungs every 4 (four) hours as needed for wheezing or shortness of breath.    . busPIRone (BUSPAR) 10 MG tablet Take 10 mg by mouth continuous as needed.    Marland Kitchen dextroamphetamine (DEXTROSTAT) 10 MG tablet   0  . ferrous sulfate 325 (65 FE) MG EC tablet Take 325 mg by mouth 3 (three) times daily with meals.    Marland Kitchen lisdexamfetamine (VYVANSE) 70 MG capsule Take 70 mg by mouth daily.    . Magnesium Oxide 200 MG TABS Take 200 mg by mouth continuous as needed.    . Rivaroxaban (XARELTO) 15 MG TABS tablet Take 1 tablet (15 mg total) by mouth 2 (two) times daily with a meal. 28 tablet 0  . vitamin C (ASCORBIC ACID) 500 MG tablet Take 500 mg  by mouth daily.     No current facility-administered medications on file prior to visit.          Review of Systems:  Review of Systems  Constitutional: Denied constitutional symptoms, night sweats, recent illness, fatigue, fever, insomnia and weight loss.  Eyes: Denied eye symptoms, eye pain, photophobia, vision change and visual disturbance.  Ears/Nose/Throat/Neck: Denied ear, nose, throat or neck symptoms, hearing loss, nasal discharge, sinus congestion and sore throat.  Cardiovascular: Denied cardiovascular symptoms, arrhythmia, chest pain/pressure, edema, exercise intolerance, orthopnea and palpitations.  Respiratory: Denied pulmonary symptoms, asthma, pleuritic pain, productive sputum, cough, dyspnea and wheezing.  Gastrointestinal: Denied, gastro-esophageal reflux, melena, nausea and vomiting.  Genitourinary: Denied genitourinary symptoms including symptomatic vaginal discharge, pelvic relaxation issues, and urinary complaints.  Musculoskeletal: Denied musculoskeletal symptoms, stiffness, swelling, muscle weakness and myalgia.  Dermatologic: Denied dermatology symptoms, rash and scar.  Neurologic: Denied neurology symptoms, dizziness, headache, neck pain and syncope.  Psychiatric: Denied psychiatric symptoms, anxiety and depression.  Endocrine: Denied endocrine symptoms including hot flashes and night sweats.   Meds:   Current Outpatient Prescriptions on File Prior to Visit  Medication Sig Dispense Refill  . albuterol (PROVENTIL HFA;VENTOLIN HFA) 108 (90 Base) MCG/ACT inhaler Inhale 2 puffs into the lungs every 4 (four) hours as needed for wheezing or shortness of breath.    Marland Kitchen  busPIRone (BUSPAR) 10 MG tablet Take 10 mg by mouth continuous as needed.    Marland Kitchen dextroamphetamine (DEXTROSTAT) 10 MG tablet   0  . ferrous sulfate 325 (65 FE) MG EC tablet Take 325 mg by mouth 3 (three) times daily with meals.    Marland Kitchen lisdexamfetamine (VYVANSE) 70 MG capsule Take 70 mg by mouth daily.    .  Magnesium Oxide 200 MG TABS Take 200 mg by mouth continuous as needed.    . Rivaroxaban (XARELTO) 15 MG TABS tablet Take 1 tablet (15 mg total) by mouth 2 (two) times daily with a meal. 28 tablet 0  . vitamin C (ASCORBIC ACID) 500 MG tablet Take 500 mg by mouth daily.     No current facility-administered medications on file prior to visit.     Objective:     Vitals:   05/10/16 0822  BP: 118/78  Pulse: (!) 103              Physical examination General NAD, Conversant  HEENT Atraumatic; Op clear with mmm.  Normo-cephalic. Pupils reactive. Anicteric sclerae  Thyroid/Neck Smooth without nodularity or enlargement. Normal ROM.  Neck Supple.  Skin No rashes, lesions or ulceration. Normal palpated skin turgor. No nodularity.  Breasts: No masses or discharge.  Symmetric.  No axillary adenopathy.  Lungs: Clear to auscultation.No rales or wheezes. Normal Respiratory effort, no retractions.  Heart: NSR.  No murmurs or rubs appreciated. No periferal edema  Abdomen: Soft.  Non-tender.  No masses.  No HSM. No hernia  Extremities: Moves all appropriately.  Normal ROM for age. No lymphadenopathy.  Neuro: Oriented to PPT.  Normal mood. Normal affect.     Pelvic:   Vulva: Normal appearance.  No lesions.  Vagina: No lesions or abnormalities noted.  Support: Normal pelvic support.  Urethra No masses tenderness or scarring.  Meatus Normal size without lesions or prolapse.  Cervix: Normal appearance.  No lesions.  IUD strings noted   Anus: Normal exam.  No lesions.  Perineum: Normal exam.  No lesions.        Bimanual   Uterus: Normal size.  Non-tender.  Mobile.  Midposition   Adnexae: No masses.  Non-tender to palpation.  Cul-de-sac: Negative for abnormality.      Assessment:    [id number] Dana Lewis Active Problem List   Diagnosis Date Noted  . History of pulmonary embolism 04/14/2016  . Pulmonary embolism ([place]) 03/29/2016     1. Encounter for annual routine gynecological examination   2.  Screening for breast cancer     Dana Lewis doing well with IUD. I expect spotting to stop any time.   Plan:            1.  Basic Screening Recommendations The basic screening recommendations for asymptomatic women were discussed with the Dana Lewis during her visit.  The age-appropriate recommendations were discussed with her and the rational for the tests reviewed.  When I am informed by the Dana Lewis that another primary care physician has previously obtained the age-appropriate tests and they are up-to-date, only outstanding tests are ordered and referrals given as necessary.  Abnormal results of tests will be discussed with her when all of her results are completed. Pap performed-mammogram ordered Orders Orders Placed This Encounter  Procedures  . MM DIGITAL SCREENING BILATERAL          F/U  Return in about 1 year (around 05/10/2017) for Annual Physical, We will contact her with any abnormal test results.  Finis Bud, M.D.  05/10/2016 9:30 AM

## 2016-05-12 ENCOUNTER — Telehealth: Payer: Self-pay

## 2016-05-12 ENCOUNTER — Encounter: Payer: BLUE CROSS/BLUE SHIELD | Admitting: Obstetrics and Gynecology

## 2016-05-12 LAB — PAP IG AND HPV HIGH-RISK
HPV, high-risk: NEGATIVE
PAP SMEAR COMMENT: 0

## 2016-05-12 NOTE — Telephone Encounter (Signed)
-----   Message from Harlin Heys, MD sent at 05/12/2016  3:37 PM EDT ----- Negative Pap and HPV

## 2016-05-12 NOTE — Telephone Encounter (Signed)
Message left on pts voicemail per provider testing is neg. Requested a call back to ensure she got the message.

## 2016-05-13 NOTE — Telephone Encounter (Signed)
Informed pt of neg results per provider

## 2016-05-14 ENCOUNTER — Other Ambulatory Visit: Payer: Self-pay | Admitting: *Deleted

## 2016-05-14 MED ORDER — RIVAROXABAN 20 MG PO TABS: 20.0000 mg | ORAL_TABLET | Freq: Every day | ORAL | 1 refills | Status: DC

## 2016-05-14 NOTE — Telephone Encounter (Signed)
Called to report that she is out of 20 mg tabs of Xarelto, she had restarted on the 15 then switched back to 20 and is out of med and has no refills. Unable to tell me when she switched from 15 to 20 mg tabs after reinitiating Xarelto. Please advise

## 2016-05-14 NOTE — Addendum Note (Signed)
Addended by: Betti Cruz on: 05/14/2016 03:35 PM   Modules accepted: Orders

## 2016-06-07 ENCOUNTER — Emergency Department: Payer: BLUE CROSS/BLUE SHIELD

## 2016-06-07 ENCOUNTER — Emergency Department
Admission: EM | Admit: 2016-06-07 | Discharge: 2016-06-07 | Disposition: A | Discharge status: Home / Self Care | Payer: BLUE CROSS/BLUE SHIELD | Attending: Emergency Medicine | Admitting: Emergency Medicine

## 2016-06-07 ENCOUNTER — Encounter: Payer: Self-pay | Admitting: Emergency Medicine

## 2016-06-07 DIAGNOSIS — Z79899 Other long term (current) drug therapy: Secondary | ICD-10-CM | POA: Insufficient documentation

## 2016-06-07 DIAGNOSIS — R0781 Pleurodynia: Secondary | ICD-10-CM | POA: Diagnosis not present

## 2016-06-07 DIAGNOSIS — Z87891 Personal history of nicotine dependence: Secondary | ICD-10-CM | POA: Diagnosis not present

## 2016-06-07 DIAGNOSIS — R0602 Shortness of breath: Secondary | ICD-10-CM | POA: Diagnosis not present

## 2016-06-07 DIAGNOSIS — R079 Chest pain, unspecified: Secondary | ICD-10-CM

## 2016-06-07 LAB — POCT PREGNANCY, URINE: Preg Test, Ur: NEGATIVE

## 2016-06-07 LAB — TROPONIN I

## 2016-06-07 LAB — BASIC METABOLIC PANEL
Anion gap: 8 (ref 5–15)
BUN: 12 mg/dL (ref 6–20)
CO2: 25 mmol/L (ref 22–32)
Calcium: 9.1 mg/dL (ref 8.9–10.3)
Chloride: 104 mmol/L (ref 101–111)
Creatinine, Ser: 0.96 mg/dL (ref 0.44–1.00)
GFR calc Af Amer: >60 mL/min (ref >60)
GFR calc non Af Amer: >60 mL/min (ref >60)
Glucose, Bld: 95 mg/dL (ref 65–99)
Potassium: 3.8 mmol/L (ref 3.5–5.1)
Sodium: 137 mmol/L (ref 135–145)

## 2016-06-07 LAB — CBC
HCT: 41.5 % (ref 35.0–47.0)
Hemoglobin: 13.9 g/dL (ref 12.0–16.0)
MCH: 29.4 pg (ref 26.0–34.0)
MCHC: 33.5 g/dL (ref 32.0–36.0)
MCV: 87.8 fL (ref 80.0–100.0)
PLATELETS: 283 10*3/uL (ref 150–440)
RBC: 4.73 MIL/uL (ref 3.80–5.20)
RDW: 14 % (ref 11.5–14.5)
WBC: 6.9 10*3/uL (ref 3.6–11.0)

## 2016-06-07 MED ORDER — IOPAMIDOL (ISOVUE-370) INJECTION 76%
75.0000 mL | Freq: Once | INTRAVENOUS | Status: AC | PRN
  Administered 2016-06-07: 75 mL via INTRAVENOUS

## 2016-06-07 MED ORDER — ACETAMINOPHEN 500 MG PO TABS
1000.0000 mg | ORAL_TABLET | Freq: Once | ORAL | Status: AC
  Administered 2016-06-07: 1000 mg via ORAL
  Filled 2016-06-07: qty 2

## 2016-06-07 MED ORDER — ASPIRIN 81 MG PO CHEW
324.0000 mg | CHEWABLE_TABLET | Freq: Once | ORAL | Status: AC
  Administered 2016-06-07: 324 mg via ORAL
  Filled 2016-06-07: qty 4

## 2016-06-07 NOTE — ED Triage Notes (Addendum)
Pt dx with PE in February, started on xarelto. Pt reports coming off xarelto in march due to vaginal hemorrhage and started back on a few weeks ago. Pt reports shortness of breath since Saturday, chest pain/right neck pain and diaphoresis since yesterday. Pt reports headache. Pt reports difficulties with husband at home, feels unsafe but denies any abuse. Pt very anxious in triage, tearful.

## 2016-06-07 NOTE — ED Provider Notes (Signed)
[facility] Emergency Department Provider Note  ____________________________________________  Time seen: Approximately 9:02 AM  I have reviewed the triage vital signs and the nursing notes.   HISTORY  Chief Complaint Chest Pain and Shortness of Breath   HPI Dana Lewis is a 41 y.o. female with a history of pulmonary embolism on Xarelto and ADHD who presents for evaluation of chest pain. Patient reports that the pain started yesterday, it is sharp, pleuritic, located in the right side of her chest, 4 out of 10, associated with shortness of breath. She reports that the pain is similar to her initial PE. Patient was diagnosed in the beginning of the year with an unprovoked PE and he was started on Xarelto. She had an episode of severe vaginal bleeding in February and was taken off of it. She now has a Mirena IUD in place and was restarted on Xarelto in the beginning of March. She denies dizziness, leg pain or swelling, recent travel or immobilization, hemoptysis. She is not a smoker. She denies personal or family history of ischemic heart disease. The pain is been constant. Patient is alsounder a lot of stress and is having problems with her married age. She is very teary. She denies any abuse at home.  Past Medical History:  Diagnosis Date  . ADHD   . Back pain   . Pulmonary embolism [facility])     Patient Active Problem List   Diagnosis Date Noted  . History of pulmonary embolism 04/14/2016  . Pulmonary embolism ([place]) 03/29/2016    Past Surgical History:  Procedure Laterality Date  . BACK SURGERY     2016  . TONSILLECTOMY      Prior to Admission medications   Medication Sig Start Date End Date Taking? Authorizing Provider  albuterol (PROVENTIL HFA;VENTOLIN HFA) 108 (90 Base) MCG/ACT inhaler Inhale 2 puffs into the lungs every 4 (four) hours as needed for wheezing or shortness of breath.   Yes Historical Provider, MD  Dextroamphetamine Sulfate 20 MG TABS  Take 20 mg by mouth daily.  04/08/16  Yes Historical Provider, MD  ferrous sulfate 325 (65 FE) MG EC tablet Take 325 mg by mouth daily as needed.    Yes Historical Provider, MD  lisdexamfetamine (VYVANSE) 70 MG capsule Take 70 mg by mouth daily.   Yes Historical Provider, MD  Prenatal Vit-Fe Fumarate-FA (PRENATAL MULTIVITAMIN) TABS tablet Take 1 tablet by mouth daily at 12 noon.   Yes Historical Provider, MD  rivaroxaban (XARELTO) 20 MG TABS tablet Take 1 tablet (20 mg total) by mouth daily with supper. 05/14/16  Yes Lloyd Huger, MD  vitamin C (ASCORBIC ACID) 500 MG tablet Take 500 mg by mouth daily.   Yes Historical Provider, MD    Allergies Blueberry flavor; Cat hair extract; and Fire ant  Family History  Problem Relation Age of Onset  . Diabetes Father   . Breast cancer Maternal Grandmother   . Diabetes Maternal Grandmother   . Diabetes Paternal Grandmother   . Stroke Paternal Grandmother     Social History Social History  Substance Use Topics  . Smoking status: Former Research scientist (life sciences)  . Smokeless tobacco: Never Used     Comment: quit 8 years ago  . Alcohol use Yes     Comment: occasional wine    Review of Systems  Constitutional: Negative for fever. Eyes: Negative for visual changes. ENT: Negative for sore throat. Neck: No neck pain  Cardiovascular: + chest pain. Respiratory: + shortness of breath.  Gastrointestinal: Negative for abdominal pain, vomiting or diarrhea. Genitourinary: Negative for dysuria. Musculoskeletal: Negative for back pain. Skin: Negative for rash. Neurological: Negative for headaches, weakness or numbness. Psych: No SI or HI  ____________________________________________   PHYSICAL EXAM:  VITAL SIGNS: ED Triage Vitals  Enc Vitals Group     BP 06/07/16 0828 114/73     Pulse Rate 06/07/16 0828 93     Resp 06/07/16 0828 20     Temp 06/07/16 0828 98.1 F (36.7 C)     Temp Source 06/07/16 0828 Oral     SpO2 06/07/16 0828 98 %     Weight  06/07/16 0829 220 lb (99.8 kg)     Height 06/07/16 0829 5\' 3"  (1.6 m)     Head Circumference --      Peak Flow --      Pain Score 06/07/16 0828 5     Pain Loc --      Pain Edu? --      Excl. in [place]? --     Constitutional: Alert and oriented. Well appearing and in no apparent distress. HEENT:      Head: Normocephalic and atraumatic.         Eyes: Conjunctivae are normal. Sclera is non-icteric. EOMI. PERRL      Mouth/Throat: Mucous membranes are moist.       Neck: Supple with no signs of meningismus. Cardiovascular: Regular rate and rhythm. No murmurs, gallops, or rubs. 2+ symmetrical distal pulses are present in all extremities. No JVD. Respiratory: Normal respiratory effort. Lungs are clear to auscultation bilaterally. No wheezes, crackles, or rhonchi.  Gastrointestinal: Soft, non tender, and non distended with positive bowel sounds. No rebound or guarding. Genitourinary: No CVA tenderness. Musculoskeletal: Nontender with normal range of motion in all extremities. No edema, cyanosis, or erythema of extremities. Neurologic: Normal speech and language. Face is symmetric. Moving all extremities. No gross focal neurologic deficits are appreciated. Skin: Skin is warm, dry and intact. No rash noted. Psychiatric: Mood and affect are normal. Speech and behavior are normal.  ____________________________________________   LABS (all labs ordered are listed, but only abnormal results are displayed)  Labs Reviewed  BASIC METABOLIC PANEL  CBC  TROPONIN I  TROPONIN I  POCT PREGNANCY, URINE   ____________________________________________  EKG  ED ECG REPORT I, Rudene Re, the attending physician, personally viewed and interpreted this ECG.  Normal sinus rhythm, rate of 96, normal intervals, normal axis, no ST elevations or depressions, T-wave inversion in lead 3. Unchanged from prior from February 2018 ____________________________________________  RADIOLOGY  CT chest: No visible  pulmonary embolus.  No acute cardiopulmonary disease.  ____________________________________________   PROCEDURES  Procedure(s) performed: None Procedures Critical Care performed:  None ____________________________________________   INITIAL IMPRESSION / ASSESSMENT AND PLAN / ED COURSE  41 y.o. female with a history of pulmonary embolism on Xarelto and ADHD who presents for evaluation of central pleuritic chest pain since yesterday associated with shortness of breath and similar to prior PE. Patient does have Mirena IUD which increases her chance of developing another PE. Due to multiple cases reports of patient's developing PE while on Xarelto, will perform CTA of the chest. EKG with no ischemia.  Clinical Course as of Jun 08 1535  Mon Jun 07, 2016  1300 CT negative for PE. Troponin 2 is negative. At this time I'm concerned the patient is under a lot of stress and part of her symptoms can be related to that. I recommended that she talk to  her primary care doctor. Patient was reassured and will be discharged home with close follow-up at this time.  [CV]    Clinical Course User Index [CV] Rudene Re, MD    Pertinent labs & imaging results that were available during my care of the patient were reviewed by me and considered in my medical decision making (see chart for details).    ____________________________________________   FINAL CLINICAL IMPRESSION(S) / ED DIAGNOSES  Final diagnoses:  Chest pain, unspecified type      NEW MEDICATIONS STARTED DURING THIS VISIT:  Discharge Medication List as of 06/07/2016  1:04 PM       Note:  This document was prepared using Dragon voice recognition software and may include unintentional dictation errors.    Rudene Re, MD 06/07/16 1537

## 2016-06-07 NOTE — Discharge Instructions (Signed)

## 2016-06-07 NOTE — ED Notes (Signed)
Patient transported to CT 

## 2016-06-18 ENCOUNTER — Encounter (INDEPENDENT_AMBULATORY_CARE_PROVIDER_SITE_OTHER): Payer: Self-pay

## 2016-06-18 ENCOUNTER — Other Ambulatory Visit: Payer: Self-pay

## 2016-06-18 ENCOUNTER — Inpatient Hospital Stay: Payer: BLUE CROSS/BLUE SHIELD | Attending: Oncology

## 2016-06-18 ENCOUNTER — Telehealth: Payer: Self-pay | Admitting: *Deleted

## 2016-06-18 DIAGNOSIS — N924 Excessive bleeding in the premenopausal period: Secondary | ICD-10-CM

## 2016-06-18 DIAGNOSIS — Z86718 Personal history of other venous thrombosis and embolism: Secondary | ICD-10-CM | POA: Insufficient documentation

## 2016-06-18 LAB — CBC WITH DIFFERENTIAL/PLATELET
Basophils Absolute: 0.1 10*3/uL (ref 0–0.1)
Basophils Relative: 1 %
Eosinophils Absolute: 0.2 10*3/uL (ref 0–0.7)
Eosinophils Relative: 2 %
HCT: 37.7 % (ref 35.0–47.0)
Hemoglobin: 12.8 g/dL (ref 12.0–16.0)
Lymphocytes Relative: 40 %
Lymphs Abs: 3.1 10*3/uL (ref 1.0–3.6)
MCH: 29.2 pg (ref 26.0–34.0)
MCHC: 34 g/dL (ref 32.0–36.0)
MCV: 86.1 fL (ref 80.0–100.0)
Monocytes Absolute: 0.5 10*3/uL (ref 0.2–0.9)
Monocytes Relative: 6 %
Neutro Abs: 4 10*3/uL (ref 1.4–6.5)
Neutrophils Relative %: 51 %
Platelets: 334 10*3/uL (ref 150–440)
RBC: 4.38 MIL/uL (ref 3.80–5.20)
RDW: 13.7 % (ref 11.5–14.5)
WBC: 7.7 10*3/uL (ref 3.6–11.0)

## 2016-06-18 LAB — SAMPLE TO BLOOD BANK

## 2016-06-18 NOTE — Telephone Encounter (Signed)
Per Dr Humberto Seals, have her come in for labs CBC, Hold Tube. Will see what results are. Not comfortable stopping Xarelto, may need to have a filter placed to stop Xarelto. Returned call to patient, had to leave VM for her to call back about coming in for lab check today. Will await return call

## 2016-06-18 NOTE — Telephone Encounter (Signed)
On Xarelto for PE, Had problems with bleeding when first started it and was put on Mirena , now she is having heavy bleeding with clots again and is asking if she can stop the Xarelto or does she need to come in for another blood test to see if she needs to be transfused again. Please advise

## 2016-06-18 NOTE — Telephone Encounter (Signed)
Will come in at 3:30

## 2016-06-21 ENCOUNTER — Telehealth: Payer: Self-pay | Admitting: *Deleted

## 2016-06-21 NOTE — Telephone Encounter (Signed)
Discontinue anti-coagulation.  Keep f/u as scheduled.

## 2016-06-21 NOTE — Telephone Encounter (Signed)
Continues to bleed profusely and would like to come off of the blood thinners. Had labs drawn Friday and hgb was 12.8. Please advise

## 2016-06-21 NOTE — Telephone Encounter (Signed)
Advised patietn to discontinue Xarelto and keep appt for 6/4. She repeated this back to me

## 2016-07-16 ENCOUNTER — Other Ambulatory Visit: Payer: BLUE CROSS/BLUE SHIELD

## 2016-07-19 ENCOUNTER — Inpatient Hospital Stay: Payer: BLUE CROSS/BLUE SHIELD | Attending: Oncology | Admitting: Oncology

## 2016-07-19 ENCOUNTER — Inpatient Hospital Stay: Payer: BLUE CROSS/BLUE SHIELD

## 2016-07-19 VITALS — BP 120/83 | HR 98 | Temp 98.5°F | Wt 218.3 lb

## 2016-07-19 DIAGNOSIS — Z803 Family history of malignant neoplasm of breast: Secondary | ICD-10-CM | POA: Insufficient documentation

## 2016-07-19 DIAGNOSIS — R0602 Shortness of breath: Secondary | ICD-10-CM | POA: Diagnosis not present

## 2016-07-19 DIAGNOSIS — Z87891 Personal history of nicotine dependence: Secondary | ICD-10-CM | POA: Diagnosis not present

## 2016-07-19 DIAGNOSIS — F909 Attention-deficit hyperactivity disorder, unspecified type: Secondary | ICD-10-CM | POA: Diagnosis not present

## 2016-07-19 DIAGNOSIS — M549 Dorsalgia, unspecified: Secondary | ICD-10-CM | POA: Insufficient documentation

## 2016-07-19 DIAGNOSIS — Z79899 Other long term (current) drug therapy: Secondary | ICD-10-CM | POA: Diagnosis not present

## 2016-07-19 DIAGNOSIS — I2699 Other pulmonary embolism without acute cor pulmonale: Secondary | ICD-10-CM | POA: Insufficient documentation

## 2016-07-19 DIAGNOSIS — Z7901 Long term (current) use of anticoagulants: Secondary | ICD-10-CM | POA: Diagnosis not present

## 2016-07-19 DIAGNOSIS — D649 Anemia, unspecified: Secondary | ICD-10-CM | POA: Insufficient documentation

## 2016-07-19 LAB — CBC WITH DIFFERENTIAL/PLATELET
Basophils Absolute: 0.1 10*3/uL (ref 0–0.1)
Basophils Relative: 1 %
EOS ABS: 0.2 10*3/uL (ref 0–0.7)
EOS PCT: 2 %
HCT: 38.6 % (ref 35.0–47.0)
Hemoglobin: 13.1 g/dL (ref 12.0–16.0)
LYMPHS ABS: 2.7 10*3/uL (ref 1.0–3.6)
LYMPHS PCT: 21 %
MCH: 28.6 pg (ref 26.0–34.0)
MCHC: 33.9 g/dL (ref 32.0–36.0)
MCV: 84.4 fL (ref 80.0–100.0)
MONOS PCT: 4 %
Monocytes Absolute: 0.5 10*3/uL (ref 0.2–0.9)
Neutro Abs: 9.6 10*3/uL — ABNORMAL HIGH (ref 1.4–6.5)
Neutrophils Relative %: 72 %
PLATELETS: 348 10*3/uL (ref 150–440)
RBC: 4.58 MIL/uL (ref 3.80–5.20)
RDW: 13.7 % (ref 11.5–14.5)
WBC: 13.1 10*3/uL — ABNORMAL HIGH (ref 3.6–11.0)

## 2016-07-19 NOTE — Progress Notes (Signed)
Patient here today for follow up.  Patient states no new concerns today  

## 2016-07-19 NOTE — Progress Notes (Signed)
[place]  [phone] [phone]  ID: Dana Lewis OB: May 01, 1975  MR#: [id number]  LKG#:[id number]  Patient Care Team: Barbaraann Boys, MD as PCP - General (Pediatrics)  CHIEF COMPLAINT: Pulmonary embolism  INTERVAL HISTORY: Patient returns to clinic today for further evaluation and to discuss length of anticoagulation. She continues to have occasional shortness of breath, but otherwise feels well. She has no neurologic complaints. She denies any fevers. She has good appetite and denies weight loss. She has no further chest pain. She denies any hemoptysis or cough. She has no nausea, vomiting, constipation, or diarrhea. She has no melena or hematochezia. She has no urinary complaints. Patient otherwise feels well and offers no further specific complaints.  REVIEW OF SYSTEMS:   Review of Systems  Constitutional: Negative for fever, malaise/fatigue and weight loss.  Respiratory: Positive for shortness of breath. Negative for cough and hemoptysis.   Cardiovascular: Negative.  Negative for chest pain and leg swelling.  Gastrointestinal: Negative.  Negative for abdominal pain, blood in stool and melena.  Musculoskeletal: Negative.   Skin: Negative.  Negative for rash.  Neurological: Negative for dizziness and weakness.  Endo/Heme/Allergies: Does not bruise/bleed easily.  Psychiatric/Behavioral: Negative.  The patient is not nervous/anxious.     As per HPI. Otherwise, a complete review of systems is negative.  PAST MEDICAL HISTORY: Past Medical History:  Diagnosis Date  . ADHD   . Back pain   . Pulmonary embolism ([place])     PAST SURGICAL HISTORY: Past Surgical History:  Procedure Laterality Date  . BACK SURGERY     2016  . TONSILLECTOMY      FAMILY HISTORY: Family History  Problem Relation Age of Onset  . Diabetes Father   . Breast cancer Maternal Grandmother   . Diabetes Maternal Grandmother   . Diabetes Paternal Grandmother   .  Stroke Paternal Grandmother     ADVANCED DIRECTIVES (Y/N):  N  HEALTH MAINTENANCE: Social History  Substance Use Topics  . Smoking status: Former Research scientist (life sciences)  . Smokeless tobacco: Never Used     Comment: quit 8 years ago  . Alcohol use Yes     Comment: occasional wine     Colonoscopy:  PAP:  Bone density:  Lipid panel:  Allergies  Allergen Reactions  . Blueberry Flavor Anaphylaxis  . Cat Hair Extract Dermatitis, Itching, Shortness Of Breath and Swelling  . [employer] and Swelling    Current Outpatient Prescriptions  Medication Sig Dispense Refill  . albuterol (PROVENTIL HFA;VENTOLIN HFA) 108 (90 Base) MCG/ACT inhaler Inhale 2 puffs into the lungs every 4 (four) hours as needed for wheezing or shortness of breath.    . busPIRone (BUSPAR) 10 MG tablet Take 10 mg by mouth as needed.  2  . Dextroamphetamine Sulfate 20 MG TABS Take 20 mg by mouth daily.   0  . lisdexamfetamine (VYVANSE) 70 MG capsule Take 70 mg by mouth daily.    . Multiple Vitamin (MULTIVITAMIN) tablet Take 1 tablet by mouth daily.    . vitamin C (ASCORBIC ACID) 500 MG tablet Take 500 mg by mouth daily.     No current facility-administered medications for this visit.     OBJECTIVE: Vitals:   07/19/16 1415  BP: 120/83  Pulse: 98  Temp: 98.5 F (36.9 C)     Body mass index is 38.67 kg/m.    ECOG FS:0 - Asymptomatic  General: Well-developed, well-nourished, no acute distress. Eyes: Pink conjunctiva, anicteric sclera. Lungs: Clear to auscultation bilaterally.  Heart: Regular rate and rhythm. No rubs, murmurs, or gallops. Abdomen: Soft, nontender, nondistended. No organomegaly noted, normoactive bowel sounds. Musculoskeletal: No edema, cyanosis, or clubbing. Neuro: Alert, answering all questions appropriately. Cranial nerves grossly intact. Skin: No rashes or petechiae noted. Psych: Normal affect.   LAB RESULTS:  Lab Results  Component Value Date   NA 137 06/07/2016   K 3.8 06/07/2016   CL 104  06/07/2016   CO2 25 06/07/2016   GLUCOSE 95 06/07/2016   BUN 12 06/07/2016   CREATININE 0.96 06/07/2016   CALCIUM 9.1 06/07/2016   PROT 6.3 (L) 04/13/2016   ALBUMIN 3.8 04/13/2016   AST 30 04/13/2016   ALT 41 04/13/2016   ALKPHOS 48 04/13/2016   BILITOT 0.6 04/13/2016   GFRNONAA >60 06/07/2016   GFRAA >60 06/07/2016    Lab Results  Component Value Date   WBC 13.1 (H) 07/19/2016   NEUTROABS 9.6 (H) 07/19/2016   HGB 13.1 07/19/2016   HCT 38.6 07/19/2016   MCV 84.4 07/19/2016   PLT 348 07/19/2016     STUDIES: No results found.  ASSESSMENT: Pulmonary embolism.  PLAN:    1. Pulmonary embolism:  Patient noted to have a right lower lobe pulmonary embolism, likely secondary to transient risk factor of being in bed 4-5 days with the flu. Patient's hypercoagulable workup completed in the hospital was essentially negative or within normal limits. Repeat CT scan on June 07, 2016 did not reveal evidence of pulmonary embolism. Patient will require 3 months of Xarelto. She has approximately 4 weeks left of treatment given her delays with holding treatment secondary to menstrual bleeding. 2. Anemia: Secondary to heavy menstrual bleeding after being placed on Xarelto. Her bleeding has subsided and her hemoglobin is now within normal limits. She has been instructed to continue oral iron supplementation. No further intervention is needed.  3. Menstrual bleeding: Appreciate OB/GYN input. Patient had Mirena placed. 4. Disposition: No further follow-up is necessary. Complete Xarelto as above.  Approximately 30 minutes was spent in discussion of which greater than 50% was consultation.  Patient expressed understanding and was in agreement with this plan. She also understands that She can call clinic at any time with any questions, concerns, or complaints.    Lloyd Huger, MD   07/25/2016 8:14 AM

## 2016-08-08 ENCOUNTER — Emergency Department: Payer: BLUE CROSS/BLUE SHIELD

## 2016-08-08 ENCOUNTER — Emergency Department
Admission: EM | Admit: 2016-08-08 | Discharge: 2016-08-08 | Disposition: A | Discharge status: Home / Self Care | Payer: BLUE CROSS/BLUE SHIELD | Attending: Emergency Medicine | Admitting: Emergency Medicine

## 2016-08-08 ENCOUNTER — Encounter: Payer: Self-pay | Admitting: Emergency Medicine

## 2016-08-08 DIAGNOSIS — Z7982 Long term (current) use of aspirin: Secondary | ICD-10-CM | POA: Diagnosis not present

## 2016-08-08 DIAGNOSIS — J45901 Unspecified asthma with (acute) exacerbation: Secondary | ICD-10-CM

## 2016-08-08 DIAGNOSIS — R0602 Shortness of breath: Secondary | ICD-10-CM | POA: Diagnosis present

## 2016-08-08 DIAGNOSIS — Z87891 Personal history of nicotine dependence: Secondary | ICD-10-CM | POA: Insufficient documentation

## 2016-08-08 DIAGNOSIS — Z79899 Other long term (current) drug therapy: Secondary | ICD-10-CM | POA: Insufficient documentation

## 2016-08-08 DIAGNOSIS — R0789 Other chest pain: Secondary | ICD-10-CM | POA: Diagnosis not present

## 2016-08-08 DIAGNOSIS — R42 Dizziness and giddiness: Secondary | ICD-10-CM | POA: Diagnosis not present

## 2016-08-08 LAB — BASIC METABOLIC PANEL
Anion gap: 5 (ref 5–15)
BUN: 14 mg/dL (ref 6–20)
CHLORIDE: 105 mmol/L (ref 101–111)
CO2: 28 mmol/L (ref 22–32)
CREATININE: 0.94 mg/dL (ref 0.44–1.00)
Calcium: 9.6 mg/dL (ref 8.9–10.3)
GFR calc Af Amer: >60 mL/min (ref >60)
GFR calc non Af Amer: >60 mL/min (ref >60)
GLUCOSE: 140 mg/dL — AB (ref 65–99)
POTASSIUM: 4.4 mmol/L (ref 3.5–5.1)
Sodium: 138 mmol/L (ref 135–145)

## 2016-08-08 LAB — TROPONIN I: Troponin I: <0.03 ng/mL (ref <0.03)

## 2016-08-08 LAB — CBC
HEMATOCRIT: 40 % (ref 35.0–47.0)
Hemoglobin: 13.6 g/dL (ref 12.0–16.0)
MCH: 28.4 pg (ref 26.0–34.0)
MCHC: 33.9 g/dL (ref 32.0–36.0)
MCV: 83.9 fL (ref 80.0–100.0)
PLATELETS: 401 10*3/uL (ref 150–440)
RBC: 4.76 MIL/uL (ref 3.80–5.20)
RDW: 14.6 % — ABNORMAL HIGH (ref 11.5–14.5)
WBC: 18.2 10*3/uL — ABNORMAL HIGH (ref 3.6–11.0)

## 2016-08-08 LAB — APTT: aPTT: <24 seconds — ABNORMAL LOW (ref 24–36)

## 2016-08-08 LAB — PROTIME-INR
INR: 0.94
PROTHROMBIN TIME: 12.6 s (ref 11.4–15.2)

## 2016-08-08 MED ORDER — IOPAMIDOL (ISOVUE-370) INJECTION 76%
75.0000 mL | Freq: Once | INTRAVENOUS | Status: AC | PRN
  Administered 2016-08-08: 75 mL via INTRAVENOUS

## 2016-08-08 MED ORDER — SODIUM CHLORIDE 0.9 % IV BOLUS (SEPSIS): 1000.0000 mL | Freq: Once | INTRAVENOUS | Status: DC

## 2016-08-08 NOTE — Discharge Instructions (Signed)
Your CT scan today is unremarkable.  It does not show any signs of blood clots or pneumonia.  Continue taking the prednisone and albuterol as prescribed. You do not need antibiotics today. Follow up with your doctor to continue monitoring your symptoms.   Results for orders placed or performed during the hospital encounter of 19/75/88  Basic metabolic panel  Result Value Ref Range   Sodium 138 135 - 145 mmol/L   Potassium 4.4 3.5 - 5.1 mmol/L   Chloride 105 101 - 111 mmol/L   CO2 28 22 - 32 mmol/L   Glucose, Bld 140 (H) 65 - 99 mg/dL   BUN 14 6 - 20 mg/dL   Creatinine, Ser 0.94 0.44 - 1.00 mg/dL   Calcium 9.6 8.9 - 10.3 mg/dL   GFR calc non Af Amer >60 >60 mL/min   GFR calc Af Amer >60 >60 mL/min   Anion gap 5 5 - 15  CBC  Result Value Ref Range   WBC 18.2 (H) 3.6 - 11.0 K/uL   RBC 4.76 3.80 - 5.20 MIL/uL   Hemoglobin 13.6 12.0 - 16.0 g/dL   HCT 40.0 35.0 - 47.0 %   MCV 83.9 80.0 - 100.0 fL   MCH 28.4 26.0 - 34.0 pg   MCHC 33.9 32.0 - 36.0 g/dL   RDW 14.6 (H) 11.5 - 14.5 %   Platelets 401 150 - 440 K/uL  Troponin I  Result Value Ref Range   Troponin I <0.03 <0.03 ng/mL  Protime-INR  Result Value Ref Range   Prothrombin Time 12.6 11.4 - 15.2 seconds   INR 0.94   APTT  Result Value Ref Range   aPTT <24 (L) 24 - 36 seconds   Dg Chest 2 View  Result Date: 08/08/2016 CLINICAL DATA:  Difficulty breathing and cough EXAM: CHEST  2 VIEW COMPARISON:  06/07/2016 FINDINGS: The heart size and mediastinal contours are within normal limits. Both lungs are clear. The visualized skeletal structures are unremarkable. IMPRESSION: No active cardiopulmonary disease. Electronically Signed   By: Inez Catalina M.D.   On: 08/08/2016 17:48   Ct Angio Chest Pe W And/or Wo Contrast  Result Date: 08/08/2016 CLINICAL DATA:  Difficulty breathing.  Dizziness. EXAM: CT ANGIOGRAPHY CHEST WITH CONTRAST TECHNIQUE: Multidetector CT imaging of the chest was performed using the standard protocol during bolus  administration of intravenous contrast. Multiplanar CT image reconstructions and MIPs were obtained to evaluate the vascular anatomy. CONTRAST:  75 mL Isovue 370 COMPARISON:  None. FINDINGS: Cardiovascular: Satisfactory opacification of the pulmonary arteries to the segmental level. No evidence of pulmonary embolism. Normal heart size. No pericardial effusion. Mediastinum/Nodes: No enlarged mediastinal, hilar, or axillary lymph nodes. Thyroid gland, trachea, and esophagus demonstrate no significant findings. Lungs/Pleura: Lungs are clear. No pleural effusion or pneumothorax. Upper Abdomen: No acute abnormality. Musculoskeletal: No chest wall abnormality. No acute or significant osseous findings. Review of the MIP images confirms the above findings. IMPRESSION: 1. No pulmonary emboli or acute abnormality identified. Electronically Signed   By: Dorise Bullion III M.D   On: 08/08/2016 20:49

## 2016-08-08 NOTE — ED Notes (Signed)
Pt reports feeling as though she can walk out to car and denies wanting a wheelchair. Pt is not SOB at this time.

## 2016-08-08 NOTE — ED Provider Notes (Signed)
[facility] Emergency Department Provider Note  ____________________________________________  Time seen: Approximately 9:31 PM  I have reviewed the triage vital signs and the nursing notes.   HISTORY  Chief Complaint Shortness of Breath and Dizziness    HPI Dana Lewis is a 41 y.o. female with a history of PE who reports intermittent episodes of shortness of breath and pleuritic sharp chest pain over the past 2 weeks. Currently is improved. No dizziness or syncope. Also has a nonproductive cough. All started after she was exposed to mold environment. 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 Her doctor started her on a prednisone taper 2 days ago and she is also taking albuterol. Pain is nonradiating, moderate intensity when present. No alleviating factors.  She also did travel to [facility] about a week ago.     Past Medical History:  Diagnosis Date  . ADHD   . Back pain   . Pulmonary embolism [facility])      Patient Active Problem List   Diagnosis Date Noted  . History of pulmonary embolism 04/14/2016  . Pulmonary embolism ([place]) 03/29/2016     Past Surgical History:  Procedure Laterality Date  . BACK SURGERY     2016  . TONSILLECTOMY       Prior to Admission medications   Medication Sig Start Date End Date Taking? Authorizing Provider  albuterol (PROVENTIL HFA;VENTOLIN HFA) 108 (90 Base) MCG/ACT inhaler Inhale 2 puffs into the lungs every 4 (four) hours as needed for wheezing or shortness of breath.   Yes [provider]  aspirin 325 MG tablet Take 325 mg by mouth daily.   Yes [provider]  busPIRone (BUSPAR) 10 MG tablet Take 10 mg by mouth as needed. 05/26/16  Yes [provider]  Dextroamphetamine Sulfate 20 MG TABS Take 20 mg by mouth daily as needed.  04/08/16  Yes [provider]  fluticasone (FLONASE) 50 MCG/ACT nasal spray Place 1 spray into both nostrils daily.   Yes  [provider]  guaiFENesin (ROBITUSSIN) 100 MG/5ML liquid Take 200 mg by mouth 3 (three) times daily as needed for cough.   Yes [provider]  lisdexamfetamine (VYVANSE) 70 MG capsule Take 70 mg by mouth daily.   Yes [provider]  loratadine (CLARITIN) 10 MG tablet Take 10 mg by mouth daily.   Yes [provider]  montelukast (SINGULAIR) 10 MG tablet Take 10 mg by mouth daily. 08/06/16  Yes [provider]  Multiple Vitamin (MULTIVITAMIN) tablet Take 1 tablet by mouth daily.   Yes [provider]  predniSONE (DELTASONE) 10 MG tablet Take 10 mg by mouth See admin instructions. Take 4 tabs on days 1-3, 3 tabs on days 4-6, 2 tabs on days 7-9 and 1 tab on day 10, then stop 08/06/16 08/15/16 Yes [provider]  vitamin C (ASCORBIC ACID) 500 MG tablet Take 500 mg by mouth daily.   Yes [provider]     Allergies Blueberry flavor; Cat hair extract; and Fire ant   Family History  Problem Relation Age of Onset  . Diabetes Father   . Breast cancer Maternal Grandmother   . Diabetes Maternal Grandmother   . Diabetes Paternal Grandmother   . Stroke Paternal Grandmother     Social History Social History  Substance Use Topics  . Smoking status: Former Research scientist (life sciences)  . Smokeless tobacco: Never Used     Comment: quit 8 years ago  . Alcohol use  Yes     Comment: occasional wine    Review of Systems  Constitutional:   No fever or chills.  ENT:   No sore throat. No rhinorrhea. Cardiovascular:   Positive chest pain as above. No syncope. Respiratory:   Positive shortness of breath and cough. Gastrointestinal:   Negative for abdominal pain, vomiting and diarrhea.  Musculoskeletal:   Negative for focal pain or swelling All other systems reviewed and are negative except as documented above in ROS and HPI.  ____________________________________________   PHYSICAL EXAM:  VITAL SIGNS: ED Triage Vitals  Enc Vitals Group      BP 08/08/16 1713 (!) 161/91     Pulse Rate 08/08/16 1713 (!) 109     Resp 08/08/16 1713 20     Temp 08/08/16 1713 98.5 F (36.9 C)     Temp Source 08/08/16 1713 Oral     SpO2 08/08/16 1713 96 %     Weight 08/08/16 1713 220 lb (99.8 kg)     Height 08/08/16 1713 5\' 3"  (1.6 m)     Head Circumference --      Peak Flow --      Pain Score 08/08/16 1720 4     Pain Loc --      Pain Edu? --      Excl. in [place]? --     Vital signs reviewed, nursing assessments reviewed.   Constitutional:   Alert and oriented. Well appearing and in no distress. Eyes:   No scleral icterus.  EOMI. No nystagmus. No conjunctival pallor. PERRL. ENT   Head:   Normocephalic and atraumatic.   Nose:   No congestion/rhinnorhea.    Mouth/Throat:   MMM, no pharyngeal erythema. No peritonsillar mass.    Neck:   No meningismus. Full ROM Hematological/Lymphatic/Immunilogical:   No cervical lymphadenopathy. Cardiovascular:   RRR. Symmetric bilateral radial and DP pulses.  No murmurs.  Respiratory:   Normal respiratory effort without tachypnea/retractions. Breath sounds are clear and equal bilaterally. No wheezes/rales/rhonchi. Gastrointestinal:   Soft and nontender. Non distended. There is no CVA tenderness.  No rebound, rigidity, or guarding. Genitourinary:   deferred Musculoskeletal:   Normal range of motion in all extremities. No joint effusions.  No lower extremity tenderness.  No edema. Neurologic:   Normal speech and language.  Motor grossly intact. No gross focal neurologic deficits are appreciated.  Skin:    Skin is warm, dry and intact. No rash noted.  No petechiae, purpura, or bullae.  ____________________________________________    LABS (pertinent positives/negatives) (all labs ordered are listed, but only abnormal results are displayed) Labs Reviewed  BASIC METABOLIC PANEL - Abnormal; Notable for the following:       Result Value   Glucose, Bld 140 (*)    All other components within normal  limits  CBC - Abnormal; Notable for the following:    WBC 18.2 (*)    RDW 14.6 (*)    All other components within normal limits  APTT - Abnormal; Notable for the following:    aPTT <24 (*)    All other components within normal limits  TROPONIN I  PROTIME-INR   ____________________________________________   EKG  Interpreted by me Sinus tachycardia rate 117. Normal axis intervals QRS ST segments and T waves. Not consistent with right heart strain.  ____________________________________________    RADIOLOGY  Dg Chest 2 View  Result Date: 08/08/2016 CLINICAL DATA:  Difficulty breathing and cough EXAM: CHEST  2 VIEW COMPARISON:  06/07/2016 FINDINGS: The heart size  and mediastinal contours are within normal limits. Both lungs are clear. The visualized skeletal structures are unremarkable. IMPRESSION: No active cardiopulmonary disease. Electronically Signed   By: Inez Catalina M.D.   On: 08/08/2016 17:48   Ct Angio Chest Pe W And/or Wo Contrast  Result Date: 08/08/2016 CLINICAL DATA:  Difficulty breathing.  Dizziness. EXAM: CT ANGIOGRAPHY CHEST WITH CONTRAST TECHNIQUE: Multidetector CT imaging of the chest was performed using the standard protocol during bolus administration of intravenous contrast. Multiplanar CT image reconstructions and MIPs were obtained to evaluate the vascular anatomy. CONTRAST:  75 mL Isovue 370 COMPARISON:  None. FINDINGS: Cardiovascular: Satisfactory opacification of the pulmonary arteries to the segmental level. No evidence of pulmonary embolism. Normal heart size. No pericardial effusion. Mediastinum/Nodes: No enlarged mediastinal, hilar, or axillary lymph nodes. Thyroid gland, trachea, and esophagus demonstrate no significant findings. Lungs/Pleura: Lungs are clear. No pleural effusion or pneumothorax. Upper Abdomen: No acute abnormality. Musculoskeletal: No chest wall abnormality. No acute or significant osseous findings. Review of the MIP images confirms the above  findings. IMPRESSION: 1. No pulmonary emboli or acute abnormality identified. Electronically Signed   By: Dorise Bullion III M.D   On: 08/08/2016 20:49    ____________________________________________   PROCEDURES Procedures  ____________________________________________   INITIAL IMPRESSION / ASSESSMENT AND PLAN / ED COURSE  Pertinent labs & imaging results that were available during my care of the patient were reviewed by me and considered in my medical decision making (see chart for details).  Patient presents with intermittent pleuritic chest pain. Atypical presentation.Considering the patient's symptoms, medical history, and physical examination today, I have low suspicion for ACS, PE, TAD, pneumothorax, carditis, mediastinitis, pneumonia, CHF, or sepsis.  Given her recent travel and history of PE and not being on anticoagulants, she is at elevated risk for PE. CT scan was obtained which was negative. Likely this is allergic bronchitis from her mold exposure. Continue albuterol and prednisone and primary care follow-up. Tachycardia seen at triage has resolved upon my exam with a heart rate of about 80.      ____________________________________________   FINAL CLINICAL IMPRESSION(S) / ED DIAGNOSES  Final diagnoses:  Bronchitis, allergic, unspecified asthma severity, with acute exacerbation  Atypical chest pain    New Prescriptions   No medications on file     Portions of this note were generated with dragon dictation software. Dictation errors may occur despite best attempts at proofreading.    Carrie Mew, MD 08/08/16 2134

## 2016-08-08 NOTE — ED Triage Notes (Signed)
C/O difficulty breathing and dizziness.  STates episodes are intermittent.  Seen by PCP on Friday and diagnosed with pneumonia.  Given prednisone and zymbalta and patient is taking claritn as prescribed.  Patient is anxoius and tearful in triage.  Voice clear and strong.

## 2016-08-16 ENCOUNTER — Telehealth: Payer: Self-pay

## 2016-08-16 ENCOUNTER — Encounter: Payer: Self-pay | Admitting: Obstetrics and Gynecology

## 2016-08-16 ENCOUNTER — Other Ambulatory Visit: Payer: Self-pay | Admitting: Obstetrics and Gynecology

## 2016-08-16 ENCOUNTER — Ambulatory Visit (INDEPENDENT_AMBULATORY_CARE_PROVIDER_SITE_OTHER): Payer: BLUE CROSS/BLUE SHIELD | Admitting: Obstetrics and Gynecology

## 2016-08-16 ENCOUNTER — Ambulatory Visit (INDEPENDENT_AMBULATORY_CARE_PROVIDER_SITE_OTHER): Payer: BLUE CROSS/BLUE SHIELD

## 2016-08-16 ENCOUNTER — Ambulatory Visit
Admission: RE | Admit: 2016-08-16 | Discharge: 2016-08-16 | Disposition: A | Discharge status: Home / Self Care | Payer: BLUE CROSS/BLUE SHIELD | Source: Ambulatory Visit | Attending: Obstetrics and Gynecology | Admitting: Obstetrics and Gynecology

## 2016-08-16 VITALS — BP 130/85 | HR 108 | Ht 63.0 in | Wt 220.0 lb

## 2016-08-16 DIAGNOSIS — T8332XA Displacement of intrauterine contraceptive device, initial encounter: Secondary | ICD-10-CM | POA: Insufficient documentation

## 2016-08-16 DIAGNOSIS — Z3009 Encounter for other general counseling and advice on contraception: Secondary | ICD-10-CM

## 2016-08-16 DIAGNOSIS — X58XXXA Exposure to other specified factors, initial encounter: Secondary | ICD-10-CM | POA: Insufficient documentation

## 2016-08-16 NOTE — Telephone Encounter (Signed)
Left message for Dana Lewis- per provider- she must have passed the IUD with the clots she passed. U/S shows no IUD in place.

## 2016-08-16 NOTE — Progress Notes (Addendum)
HPI:      [name] is a 41 y.o. [id number] who LMP was Patient's last menstrual period was 08/06/2016 (exact date).  Subjective:   She presents today Requesting IUD removal. Her husband now has a vasectomy. Patient states that last month she had an extremely heavy period with large clots.  Her hematologist stopped her blood thinners. She no longer needs them for her PE.  And this month she had her regular period. She sees no reason to keep her IUD.    Hx: The following portions of the patient's history were reviewed and updated as appropriate:             She  has a past medical history of ADHD; Back pain; Pneumonia; and Pulmonary embolism ([place]). She  does not have any pertinent problems on file. She  has a past surgical history that includes Back surgery and Tonsillectomy. Her family history includes Breast cancer in her maternal grandmother; Diabetes in her father, maternal grandmother, and paternal grandmother; Stroke in her paternal grandmother. She  reports that she has quit smoking. She has never used smokeless tobacco. She reports that she drinks alcohol. She reports that she does not use drugs. She has a current medication list which includes the following prescription(s): albuterol, aspirin, buspirone, dextroamphetamine sulfate, fluticasone, guaifenesin, lisdexamfetamine, loratadine, montelukast, multivitamin, and vitamin c.       Review of Systems:  Review of Systems  Constitutional: Denied constitutional symptoms, night sweats, recent illness, fatigue, fever, insomnia and weight loss.  Eyes: Denied eye symptoms, eye pain, photophobia, vision change and visual disturbance.  Ears/Nose/Throat/Neck: Denied ear, nose, throat or neck symptoms, hearing loss, nasal discharge, sinus congestion and sore throat.  Cardiovascular: Denied cardiovascular symptoms, arrhythmia, chest pain/pressure, edema, exercise intolerance, orthopnea and palpitations.  Respiratory: Denied pulmonary symptoms,  asthma, pleuritic pain, productive sputum, cough, dyspnea and wheezing.  Gastrointestinal: Denied, gastro-esophageal reflux, melena, nausea and vomiting.  Genitourinary: Denied genitourinary symptoms including symptomatic vaginal discharge, pelvic relaxation issues, and urinary complaints.  Musculoskeletal: Denied musculoskeletal symptoms, stiffness, swelling, muscle weakness and myalgia.  Dermatologic: Denied dermatology symptoms, rash and scar.  Neurologic: Denied neurology symptoms, dizziness, headache, neck pain and syncope.  Psychiatric: Denied psychiatric symptoms, anxiety and depression.  Endocrine: Denied endocrine symptoms including hot flashes and night sweats.   Meds:   Current Outpatient Prescriptions on File Prior to Visit  Medication Sig Dispense Refill  . albuterol (PROVENTIL HFA;VENTOLIN HFA) 108 (90 Base) MCG/ACT inhaler Inhale 2 puffs into the lungs every 4 (four) hours as needed for wheezing or shortness of breath.    Marland Kitchen aspirin 325 MG tablet Take 325 mg by mouth daily.    . busPIRone (BUSPAR) 10 MG tablet Take 10 mg by mouth as needed.  2  . Dextroamphetamine Sulfate 20 MG TABS Take 20 mg by mouth daily as needed.   0  . fluticasone (FLONASE) 50 MCG/ACT nasal spray Place 1 spray into both nostrils daily.    Marland Kitchen guaiFENesin (ROBITUSSIN) 100 MG/5ML liquid Take 200 mg by mouth 3 (three) times daily as needed for cough.    . lisdexamfetamine (VYVANSE) 70 MG capsule Take 70 mg by mouth daily.    Marland Kitchen loratadine (CLARITIN) 10 MG tablet Take 10 mg by mouth daily.    . montelukast (SINGULAIR) 10 MG tablet Take 10 mg by mouth daily.  11  . Multiple Vitamin (MULTIVITAMIN) tablet Take 1 tablet by mouth daily.    . vitamin C (ASCORBIC ACID) 500 MG tablet Take 500 mg  by mouth daily.     No current facility-administered medications on file prior to visit.     Objective:     Vitals:   08/16/16 0827  BP: 130/85  Pulse: (!) 108              Physical examination   Pelvic:   Vulva:  Normal appearance.  No lesions.  Vagina: No lesions or abnormalities noted.  Support: Normal pelvic support.  Urethra No masses tenderness or scarring.  Meatus Normal size without lesions or prolapse.  Cervix: Normal appearance.  No lesions.  Anus: Normal exam.  No lesions.  Perineum: Normal exam.  No lesions.        Bimanual   Uterus: Normal size.  Non-tender.  Mobile.  AV.  Adnexae: No masses.  Non-tender to palpation.  Cul-de-sac: Negative for abnormality.   IUD not seen at cervical os. No strings in cervical canal.  Assessment:    [id number] Patient Active Problem List   Diagnosis Date Noted  . History of pulmonary embolism 04/14/2016  . Pulmonary embolism ([place]) 03/29/2016     1. Birth control counseling     IUD possibly not present. Possibly passed with clots.   Plan:            1.  Ultrasound for location of IUD. Discussed in detail with the patient. Orders No orders of the defined types were placed in this encounter.   No orders of the defined types were placed in this encounter.       F/U  No Follow-up on file.  Finis Bud, M.D. 08/16/2016 9:33 AM  Ultrasound performed and no intrauterine IUD identified.  Plan:  Abdominal flat plate abdomen.

## 2016-08-16 NOTE — Addendum Note (Signed)
Addended by: Finis Bud on: 08/16/2016 03:06 PM   Modules accepted: Orders

## 2016-08-17 NOTE — Telephone Encounter (Signed)
Message left on pts voicemail to please return call

## 2016-08-23 ENCOUNTER — Telehealth: Payer: Self-pay

## 2016-08-23 NOTE — Telephone Encounter (Signed)
Pt returned my call- results  of u/s given- no IUD found/seen.

## 2017-04-11 ENCOUNTER — Ambulatory Visit: Payer: BLUE CROSS/BLUE SHIELD | Admitting: Obstetrics and Gynecology

## 2017-04-18 ENCOUNTER — Ambulatory Visit: Payer: BLUE CROSS/BLUE SHIELD | Admitting: Obstetrics and Gynecology

## 2017-06-24 ENCOUNTER — Encounter: Payer: Self-pay | Admitting: Emergency Medicine

## 2017-06-24 ENCOUNTER — Other Ambulatory Visit: Payer: Self-pay

## 2017-06-24 ENCOUNTER — Emergency Department
Admission: EM | Admit: 2017-06-24 | Discharge: 2017-06-24 | Disposition: A | Discharge status: Home / Self Care | Payer: BLUE CROSS/BLUE SHIELD | Attending: Emergency Medicine | Admitting: Emergency Medicine

## 2017-06-24 ENCOUNTER — Emergency Department: Payer: BLUE CROSS/BLUE SHIELD

## 2017-06-24 DIAGNOSIS — S39012A Strain of muscle, fascia and tendon of lower back, initial encounter: Secondary | ICD-10-CM | POA: Diagnosis not present

## 2017-06-24 DIAGNOSIS — Z7982 Long term (current) use of aspirin: Secondary | ICD-10-CM | POA: Insufficient documentation

## 2017-06-24 DIAGNOSIS — X509XXA Other and unspecified overexertion or strenuous movements or postures, initial encounter: Secondary | ICD-10-CM | POA: Insufficient documentation

## 2017-06-24 DIAGNOSIS — Z87891 Personal history of nicotine dependence: Secondary | ICD-10-CM | POA: Insufficient documentation

## 2017-06-24 DIAGNOSIS — Y929 Unspecified place or not applicable: Secondary | ICD-10-CM | POA: Diagnosis not present

## 2017-06-24 DIAGNOSIS — Y999 Unspecified external cause status: Secondary | ICD-10-CM | POA: Insufficient documentation

## 2017-06-24 DIAGNOSIS — M5431 Sciatica, right side: Secondary | ICD-10-CM | POA: Diagnosis not present

## 2017-06-24 DIAGNOSIS — S3992XA Unspecified injury of lower back, initial encounter: Secondary | ICD-10-CM | POA: Diagnosis present

## 2017-06-24 DIAGNOSIS — Y9389 Activity, other specified: Secondary | ICD-10-CM | POA: Diagnosis not present

## 2017-06-24 DIAGNOSIS — Z79899 Other long term (current) drug therapy: Secondary | ICD-10-CM | POA: Diagnosis not present

## 2017-06-24 MED ORDER — OXYCODONE-ACETAMINOPHEN 7.5-325 MG PO TABS: 1.0000 | ORAL_TABLET | Freq: Four times a day (QID) | ORAL | 0 refills | Status: DC | PRN

## 2017-06-24 MED ORDER — METHYLPREDNISOLONE SODIUM SUCC 125 MG IJ SOLR
125.0000 mg | Freq: Once | INTRAMUSCULAR | Status: AC
  Administered 2017-06-24: 125 mg via INTRAMUSCULAR
  Filled 2017-06-24: qty 2

## 2017-06-24 MED ORDER — ORPHENADRINE CITRATE 30 MG/ML IJ SOLN
60.0000 mg | Freq: Two times a day (BID) | INTRAMUSCULAR | Status: DC
Start: 2017-06-24 — End: 2017-06-24
  Administered 2017-06-24: 60 mg via INTRAMUSCULAR
  Filled 2017-06-24: qty 2

## 2017-06-24 MED ORDER — CYCLOBENZAPRINE HCL 10 MG PO TABS
10.0000 mg | ORAL_TABLET | Freq: Once | ORAL | Status: AC
  Administered 2017-06-24: 10 mg via ORAL
  Filled 2017-06-24: qty 1

## 2017-06-24 MED ORDER — ORPHENADRINE CITRATE ER 100 MG PO TB12: 100.0000 mg | ORAL_TABLET | Freq: Two times a day (BID) | ORAL | 0 refills | Status: DC

## 2017-06-24 MED ORDER — METHYLPREDNISOLONE 4 MG PO TBPK: ORAL_TABLET | ORAL | 0 refills | Status: DC

## 2017-06-24 MED ORDER — HYDROMORPHONE HCL 1 MG/ML IJ SOLN
1.0000 mg | Freq: Once | INTRAMUSCULAR | Status: AC
  Administered 2017-06-24: 1 mg via INTRAMUSCULAR
  Filled 2017-06-24: qty 1
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
## 2017-06-24 NOTE — ED Notes (Signed)
Back from x-ray  Provider in with pt

## 2017-06-24 NOTE — ED Notes (Signed)
Husband at bedside   Awaiting meds from pharmacy

## 2017-06-24 NOTE — Discharge Instructions (Addendum)
Advised to follow-up with PCP for continued care.

## 2017-06-24 NOTE — ED Triage Notes (Signed)
Pt to ED via POV c/o back pain x 4 days. Pt states that the pain radiates down her right leg into her heel. Pt had spinal surgery 2 years ago. Pt is in NAD at this time.

## 2017-06-24 NOTE — ED Notes (Signed)
See triage note presents with lower back pain which radiates into right leg  Sx's started 4 days ago.

## 2017-09-19 ENCOUNTER — Emergency Department: Payer: BLUE CROSS/BLUE SHIELD

## 2017-09-19 ENCOUNTER — Encounter: Payer: Self-pay | Admitting: Emergency Medicine

## 2017-09-19 ENCOUNTER — Emergency Department
Admission: EM | Admit: 2017-09-19 | Discharge: 2017-09-19 | Disposition: A | Discharge status: Home / Self Care | Payer: BLUE CROSS/BLUE SHIELD | Attending: Student in an Organized Health Care Education/Training Program | Admitting: Student in an Organized Health Care Education/Training Program

## 2017-09-19 ENCOUNTER — Other Ambulatory Visit: Payer: Self-pay

## 2017-09-19 DIAGNOSIS — N3001 Acute cystitis with hematuria: Secondary | ICD-10-CM | POA: Diagnosis not present

## 2017-09-19 DIAGNOSIS — Z87891 Personal history of nicotine dependence: Secondary | ICD-10-CM | POA: Diagnosis not present

## 2017-09-19 DIAGNOSIS — Z79899 Other long term (current) drug therapy: Secondary | ICD-10-CM | POA: Insufficient documentation

## 2017-09-19 DIAGNOSIS — F909 Attention-deficit hyperactivity disorder, unspecified type: Secondary | ICD-10-CM | POA: Insufficient documentation

## 2017-09-19 DIAGNOSIS — M5431 Sciatica, right side: Secondary | ICD-10-CM | POA: Diagnosis not present

## 2017-09-19 DIAGNOSIS — N200 Calculus of kidney: Secondary | ICD-10-CM | POA: Insufficient documentation

## 2017-09-19 DIAGNOSIS — R109 Unspecified abdominal pain: Secondary | ICD-10-CM | POA: Diagnosis present

## 2017-09-19 LAB — URINALYSIS, COMPLETE (UACMP) WITH MICROSCOPIC
Glucose, UA: NEGATIVE mg/dL
HGB URINE DIPSTICK: NEGATIVE
Ketones, ur: NEGATIVE mg/dL
Nitrite: NEGATIVE
PROTEIN: 30 mg/dL — AB
SPECIFIC GRAVITY, URINE: 1.029 (ref 1.005–1.030)
pH: 7 (ref 5.0–8.0)

## 2017-09-19 LAB — POCT PREGNANCY, URINE: Preg Test, Ur: NEGATIVE

## 2017-09-19 LAB — CBC
HEMATOCRIT: 39.9 % (ref 35.0–47.0)
HEMOGLOBIN: 13.7 g/dL (ref 12.0–16.0)
MCH: 30.7 pg (ref 26.0–34.0)
MCHC: 34.2 g/dL (ref 32.0–36.0)
MCV: 89.7 fL (ref 80.0–100.0)
Platelets: 312 10*3/uL (ref 150–440)
RBC: 4.45 MIL/uL (ref 3.80–5.20)
RDW: 13.2 % (ref 11.5–14.5)
WBC: 10.2 10*3/uL (ref 3.6–11.0)

## 2017-09-19 LAB — COMPREHENSIVE METABOLIC PANEL
ALBUMIN: 3.6 g/dL (ref 3.5–5.0)
ALK PHOS: 55 U/L (ref 38–126)
ALT: 22 U/L (ref 0–44)
AST: 21 U/L (ref 15–41)
Anion gap: 8 (ref 5–15)
BILIRUBIN TOTAL: 0.5 mg/dL (ref 0.3–1.2)
BUN: 13 mg/dL (ref 6–20)
CALCIUM: 8.7 mg/dL — AB (ref 8.9–10.3)
CO2: 24 mmol/L (ref 22–32)
Chloride: 109 mmol/L (ref 98–111)
Creatinine, Ser: 1.06 mg/dL — ABNORMAL HIGH (ref 0.44–1.00)
GFR calc Af Amer: >60 mL/min (ref >60)
GFR calc non Af Amer: >60 mL/min (ref >60)
GLUCOSE: 95 mg/dL (ref 70–99)
Potassium: 4 mmol/L (ref 3.5–5.1)
Sodium: 141 mmol/L (ref 135–145)
Total Protein: 6.3 g/dL — ABNORMAL LOW (ref 6.5–8.1)

## 2017-09-19 MED ORDER — CYCLOBENZAPRINE HCL 5 MG PO TABS: ORAL_TABLET | ORAL | 0 refills | Status: DC

## 2017-09-19 MED ORDER — LIDOCAINE 5 % EX PTCH
1.0000 | MEDICATED_PATCH | CUTANEOUS | Status: DC
  Administered 2017-09-19: 1 via TRANSDERMAL
  Filled 2017-09-19: qty 1

## 2017-09-19 MED ORDER — ORPHENADRINE CITRATE 30 MG/ML IJ SOLN
60.0000 mg | Freq: Two times a day (BID) | INTRAMUSCULAR | Status: DC
  Administered 2017-09-19: 60 mg via INTRAMUSCULAR
  Filled 2017-09-19: qty 2

## 2017-09-19 MED ORDER — KETOROLAC TROMETHAMINE 10 MG PO TABS: 10.0000 mg | ORAL_TABLET | Freq: Four times a day (QID) | ORAL | 0 refills | Status: DC | PRN

## 2017-09-19 MED ORDER — OXYCODONE HCL 5 MG PO TABS
5.0000 mg | ORAL_TABLET | Freq: Once | ORAL | Status: AC
  Administered 2017-09-19: 5 mg via ORAL
  Filled 2017-09-19: qty 1

## 2017-09-19 MED ORDER — KETOROLAC TROMETHAMINE 30 MG/ML IJ SOLN
30.0000 mg | Freq: Once | INTRAMUSCULAR | Status: AC
  Administered 2017-09-19: 30 mg via INTRAMUSCULAR
  Filled 2017-09-19: qty 1

## 2017-09-19 MED ORDER — OXYCODONE-ACETAMINOPHEN 5-325 MG PO TABS: 1.0000 | ORAL_TABLET | ORAL | 0 refills | Status: AC | PRN

## 2017-09-19 MED ORDER — CEPHALEXIN 500 MG PO CAPS: 500.0000 mg | ORAL_CAPSULE | Freq: Two times a day (BID) | ORAL | 0 refills | Status: AC

## 2017-09-19 MED ORDER — CEPHALEXIN 500 MG PO CAPS
500.0000 mg | ORAL_CAPSULE | Freq: Once | ORAL | Status: AC
  Administered 2017-09-19: 500 mg via ORAL
  Filled 2017-09-19: qty 1

## 2017-09-19 NOTE — Discharge Instructions (Addendum)
Please follow up with primary care in 2 days. Your symptoms are consistent with sciatica. Your xray shows you also have arthritis in your back. Your urinalysis shows you also have a urinary tract infection and the CT shows that you have a non obstructing right kidney stone. Follow up with primary care for urine recheck.

## 2017-09-19 NOTE — ED Triage Notes (Signed)
Presents with back pain  States she stepped back while on chair  Missed a step  Having severe lower back and right leg pain

## 2017-09-19 NOTE — ED Provider Notes (Signed)
[facility] Emergency Department Provider Note  ____________________________________________  Time seen: Approximately 3:28 PM  I have reviewed the triage vital signs and the nursing notes.   HISTORY  Chief Complaint No chief complaint on file.    HPI Dana Lewis is a 42 y.o. female that presents to the emergency department for evaluation of right-sided low back pain that shoots into the back of right leg since last night.  Patient was standing on a chair painting walls, when she stepped wrong and back pain started.  Pain improves when she lays on her left side.  Pain worsens when she tries to lift her right leg and when she puts pressure on her right leg. Pain does not radiate into her abdomen.  No bowel or bladder dysfunction or saddle anesthesias.  She took a dose of naproxen this morning.  She also tried heat this afternoon.  She has a history of back pain and sciatica.  No fever, chills, nausea, vomiting, abdominal pain, urinary symptoms, numbness, tingling.   Past Medical History:  Diagnosis Date  . ADHD   . Back pain   . Pneumonia   . Pulmonary embolism [facility])     Patient Active Problem List   Diagnosis Date Noted  . History of pulmonary embolism 04/14/2016  . Pulmonary embolism ([place]) 03/29/2016    Past Surgical History:  Procedure Laterality Date  . BACK SURGERY     2016  . TONSILLECTOMY      Prior to Admission medications   Medication Sig Start Date End Date Taking? Authorizing Provider  albuterol (PROVENTIL HFA;VENTOLIN HFA) 108 (90 Base) MCG/ACT inhaler Inhale 2 puffs into the lungs every 4 (four) hours as needed for wheezing or shortness of breath.    [provider]  aspirin 325 MG tablet Take 325 mg by mouth daily.    [provider]  busPIRone (BUSPAR) 10 MG tablet Take 10 mg by mouth as needed. 05/26/16   [provider]  cephALEXin (KEFLEX) 500 MG capsule Take 1 capsule (500 mg total) by mouth 2 (two) times  daily for 10 days. 09/19/17 09/29/17  Laban Emperor, PA-C  cyclobenzaprine (FLEXERIL) 5 MG tablet Take 1-2 tablets 3 times daily as needed 09/19/17   Laban Emperor, PA-C  Dextroamphetamine Sulfate 20 MG TABS Take 20 mg by mouth daily as needed.  04/08/16   [provider]  fluticasone (FLONASE) 50 MCG/ACT nasal spray Place 1 spray into both nostrils daily.    [provider]  guaiFENesin (ROBITUSSIN) 100 MG/5ML liquid Take 200 mg by mouth 3 (three) times daily as needed for cough.    [provider]  ketorolac (TORADOL) 10 MG tablet Take 1 tablet (10 mg total) by mouth every 6 (six) hours as needed. 09/19/17   Laban Emperor, PA-C  lisdexamfetamine (VYVANSE) 70 MG capsule Take 70 mg by mouth daily.    [provider]  loratadine (CLARITIN) 10 MG tablet Take 10 mg by mouth daily.    [provider]  methylPREDNISolone (MEDROL DOSEPAK) 4 MG TBPK tablet Take Tapered dose as directed 06/24/17   Sable Feil, PA-C  montelukast (SINGULAIR) 10 MG tablet Take 10 mg by mouth daily. 08/06/16   [provider]  Multiple Vitamin (MULTIVITAMIN) tablet Take 1 tablet by mouth daily.    [provider]  orphenadrine (NORFLEX) 100 MG tablet Take 1 tablet (100 mg total) by mouth 2 (two) times daily. 06/24/17   Sable Feil, PA-C  oxyCODONE-acetaminophen (PERCOCET) 5-325 MG  tablet Take 1 tablet by mouth every 4 (four) hours as needed for severe pain. 09/19/17 09/19/18  Laban Emperor, PA-C  vitamin C (ASCORBIC ACID) 500 MG tablet Take 500 mg by mouth daily.    [provider]    Allergies Blueberry flavor; Cat hair extract; and Fire ant  Family History  Problem Relation Age of Onset  . Diabetes Father   . Breast cancer Maternal Grandmother   . Diabetes Maternal Grandmother   . Diabetes Paternal Grandmother   . Stroke Paternal Grandmother     Social History Social History   Tobacco Use  . Smoking status: Former Research scientist (life sciences)  . Smokeless tobacco:  Never Used  . Tobacco comment: quit 8 years ago  Substance Use Topics  . Alcohol use: Yes    Comment: occasional wine  . Drug use: No     Review of Systems  Constitutional: No fever/chills Cardiovascular: No chest pain. Respiratory: No SOB. Gastrointestinal: No abdominal pain.  No nausea, no vomiting.  Musculoskeletal: Positive for back pain.  Skin: Negative for rash, abrasions, lacerations, ecchymosis. Neurological: Negative for  numbness or tingling   ____________________________________________   PHYSICAL EXAM:  VITAL SIGNS: ED Triage Vitals  Enc Vitals Group     BP 09/19/17 1509 (!) 121/59     Pulse Rate 09/19/17 1509 92     Resp 09/19/17 1509 (!) 21     Temp 09/19/17 1509 97.6 F (36.4 C)     Temp Source 09/19/17 1509 Oral     SpO2 09/19/17 1509 99 %     Weight 09/19/17 1508 190 lb (86.2 kg)     Height 09/19/17 1508 5\' 3"  (1.6 m)     Head Circumference --      Peak Flow --      Pain Score 09/19/17 1508 10     Pain Loc --      Pain Edu? --      Excl. in [place]? --      Constitutional: Alert and oriented. Well appearing and in no acute distress. Eyes: Conjunctivae are normal. PERRL. EOMI. Head: Atraumatic. ENT:      Ears:      Nose: No congestion/rhinnorhea.      Mouth/Throat: Mucous membranes are moist.  Neck: No stridor.  Cardiovascular: Normal rate, regular rhythm.  Good peripheral circulation. Respiratory: Normal respiratory effort without tachypnea or retractions. Lungs CTAB. Good air entry to the bases with no decreased or absent breath sounds. Gastrointestinal: Bowel sounds 4 quadrants. Soft and nontender to palpation. No guarding or rigidity. No palpable masses. No distention. No CVA tenderness. Musculoskeletal: Full range of motion to all extremities. No gross deformities appreciated.  Tenderness to palpation of her right SI joint. No lumbar spinal tenderness.  Positive straight leg raise. No calf tenderness.  Neurologic:  Normal speech and language.  No gross focal neurologic deficits are appreciated.  Skin:  Skin is warm, dry and intact. No rash noted. Psychiatric: Mood and affect are normal. Speech and behavior are normal. Patient exhibits appropriate insight and judgement.   ____________________________________________   LABS (all labs ordered are listed, but only abnormal results are displayed)  Labs Reviewed  URINALYSIS, COMPLETE (UACMP) WITH MICROSCOPIC - Abnormal; Notable for the following components:      Result Value   Color, Urine YELLOW (*)    APPearance CLOUDY (*)    Bilirubin Urine MODERATE (*)    Protein, ur 30 (*)    Leukocytes, UA MODERATE (*)    Bacteria, UA MANY (*)  All other components within normal limits  COMPREHENSIVE METABOLIC PANEL - Abnormal; Notable for the following components:   Creatinine, Ser 1.06 (*)    Calcium 8.7 (*)    Total Protein 6.3 (*)    All other components within normal limits  URINE CULTURE  CBC  POC URINE PREG, ED  POCT PREGNANCY, URINE   ____________________________________________  EKG   ____________________________________________  RADIOLOGY Robinette Haines, personally viewed and evaluated these images (plain radiographs) as part of my medical decision making, as well as reviewing the written report by the radiologist.  Dg Lumbar Spine 2-3 Views  Result Date: 09/19/2017 CLINICAL DATA:  Low back pain following fall yesterday EXAM: LUMBAR SPINE - 3 VIEW COMPARISON:  06/24/2017 FINDINGS: Five lumbar type vertebral bodies are well visualized. Vertebral body height is well maintained. No anterolisthesis is noted. Mild disc space narrowing is noted at L4-5 and L5-S1. Mild osteophytic changes are seen. No soft tissue changes are noted. IMPRESSION: Mild degenerative changes are noted.  No acute abnormality is seen. Electronically Signed   By: Inez Catalina M.D.   On: 09/19/2017 16:47   Ct Renal Stone Study  Result Date: 09/19/2017 CLINICAL DATA:  42 year old female with RIGHT  flank and abdominal pain for several months. Hematuria. EXAM: CT ABDOMEN AND PELVIS WITHOUT CONTRAST TECHNIQUE: Multidetector CT imaging of the abdomen and pelvis was performed following the standard protocol without IV contrast. COMPARISON:  None. FINDINGS: Please note that parenchymal abnormalities may be missed without intravenous contrast. Lower chest: No acute abnormality Hepatobiliary: The liver and gallbladder are unremarkable. No biliary dilatation. Pancreas: Unremarkable Spleen: Unremarkable Adrenals/Urinary Tract: The kidneys, adrenal glands and bladder are unremarkable except for a nonobstructing 3 mm RIGHT LOWER pole renal calculus. There is no evidence of hydronephrosis or obstructing urinary calculi. Stomach/Bowel: Stomach is within normal limits. Appendix appears normal. No evidence of bowel wall thickening, distention, or inflammatory changes. Vascular/Lymphatic: No significant vascular findings are present. No enlarged abdominal or pelvic lymph nodes. Reproductive: Uterus and bilateral adnexa are unremarkable. Other: No ascites, focal collection or pneumoperitoneum. No abdominal wall hernia. Musculoskeletal: No acute or suspicious bony abnormalities. Mild degenerative disc disease, spondylosis and mild broad-based disc bulges at L4-5 and L5-S1 noted. IMPRESSION: 1. No evidence of acute abnormality 2. Nonobstructing 3 mm RIGHT LOWER pole renal calculus 3. Mild degenerative disc disease and broad-based disc bulges at L4-5 and L5-S1. Electronically Signed   By: Margarette Canada M.D.   On: 09/19/2017 17:31    ____________________________________________    PROCEDURES  Procedure(s) performed:    Procedures    Medications  orphenadrine (NORFLEX) injection 60 mg (60 mg Intramuscular Given 09/19/17 1554)  lidocaine (LIDODERM) 5 % 1 patch (1 patch Transdermal Patch Applied 09/19/17 1601)  ketorolac (TORADOL) 30 MG/ML injection 30 mg (30 mg Intramuscular Given 09/19/17 1554)  oxyCODONE (Oxy  IR/ROXICODONE) immediate release tablet 5 mg (5 mg Oral Given 09/19/17 1743)  cephALEXin (KEFLEX) capsule 500 mg (500 mg Oral Given 09/19/17 1911)     ____________________________________________   INITIAL IMPRESSION / ASSESSMENT AND PLAN / ED COURSE  Pertinent labs & imaging results that were available during my care of the patient were reviewed by me and considered in my medical decision making (see chart for details).  Review of the [place] CSRS was performed in accordance of the [place] prior to dispensing any controlled drugs.   Patient's diagnosis is consistent with sciatica, UTI, kidney stone.  Vital signs and exam are reassuring.  X-ray consistent with degenerative changes.  Urinalysis consistent with infection.  CT was used to evaluate blood in urine.  CT consistent with nonobstructing right kidney stone.  Lab work was ordered to evaluate bili in urine.  Liver function is within normal limits.  No abdominal pain.  I suspect that symptoms are related to sciatica and not UTI or kidney stone.  Patient's tenderness to palpation is over right SI joint and is lower than kidneys.  She denies any urinary symptoms or radiating pain.  Pain improved with medication and patient is up and walking around.  Patient will be discharged home with prescriptions for Toradol, Flexeril, Percocet, Keflex. Patient is to follow up with primary care, urology, and ortho as directed. Patient is given ED precautions to return to the ED for any worsening or new symptoms.     ____________________________________________  FINAL CLINICAL IMPRESSION(S) / ED DIAGNOSES  Final diagnoses:  Acute cystitis with hematuria  Kidney stone  Sciatica of right side      NEW MEDICATIONS STARTED DURING THIS VISIT:  ED Discharge Orders        Ordered    ketorolac (TORADOL) 10 MG tablet  Every 6 hours PRN     09/19/17 1846    cyclobenzaprine (FLEXERIL) 5 MG tablet     09/19/17 1846    cephALEXin (KEFLEX) 500 MG capsule  2 times  daily     09/19/17 1846    oxyCODONE-acetaminophen (PERCOCET) 5-325 MG tablet  Every 4 hours PRN     09/19/17 1846          This chart was dictated using voice recognition software/Dragon. Despite best efforts to proofread, errors can occur which can change the meaning. Any change was purely unintentional.    Laban Emperor, PA-C 09/19/17 2355    Merlyn Lot, MD 09/20/17 [phone]

## 2017-09-21 LAB — URINE CULTURE

## 2018-07-26 IMAGING — CR DG CHEST 2V
1 series · 2 of 2 positions shown · non-contrast
Comparison: None.

CLINICAL DATA: Cough, shortness of breath.

EXAM:
CHEST  2 VIEW

[Series 1: w chest pa · 0.14mm/px · 2 of 2 slices shown]
[im 1/2]
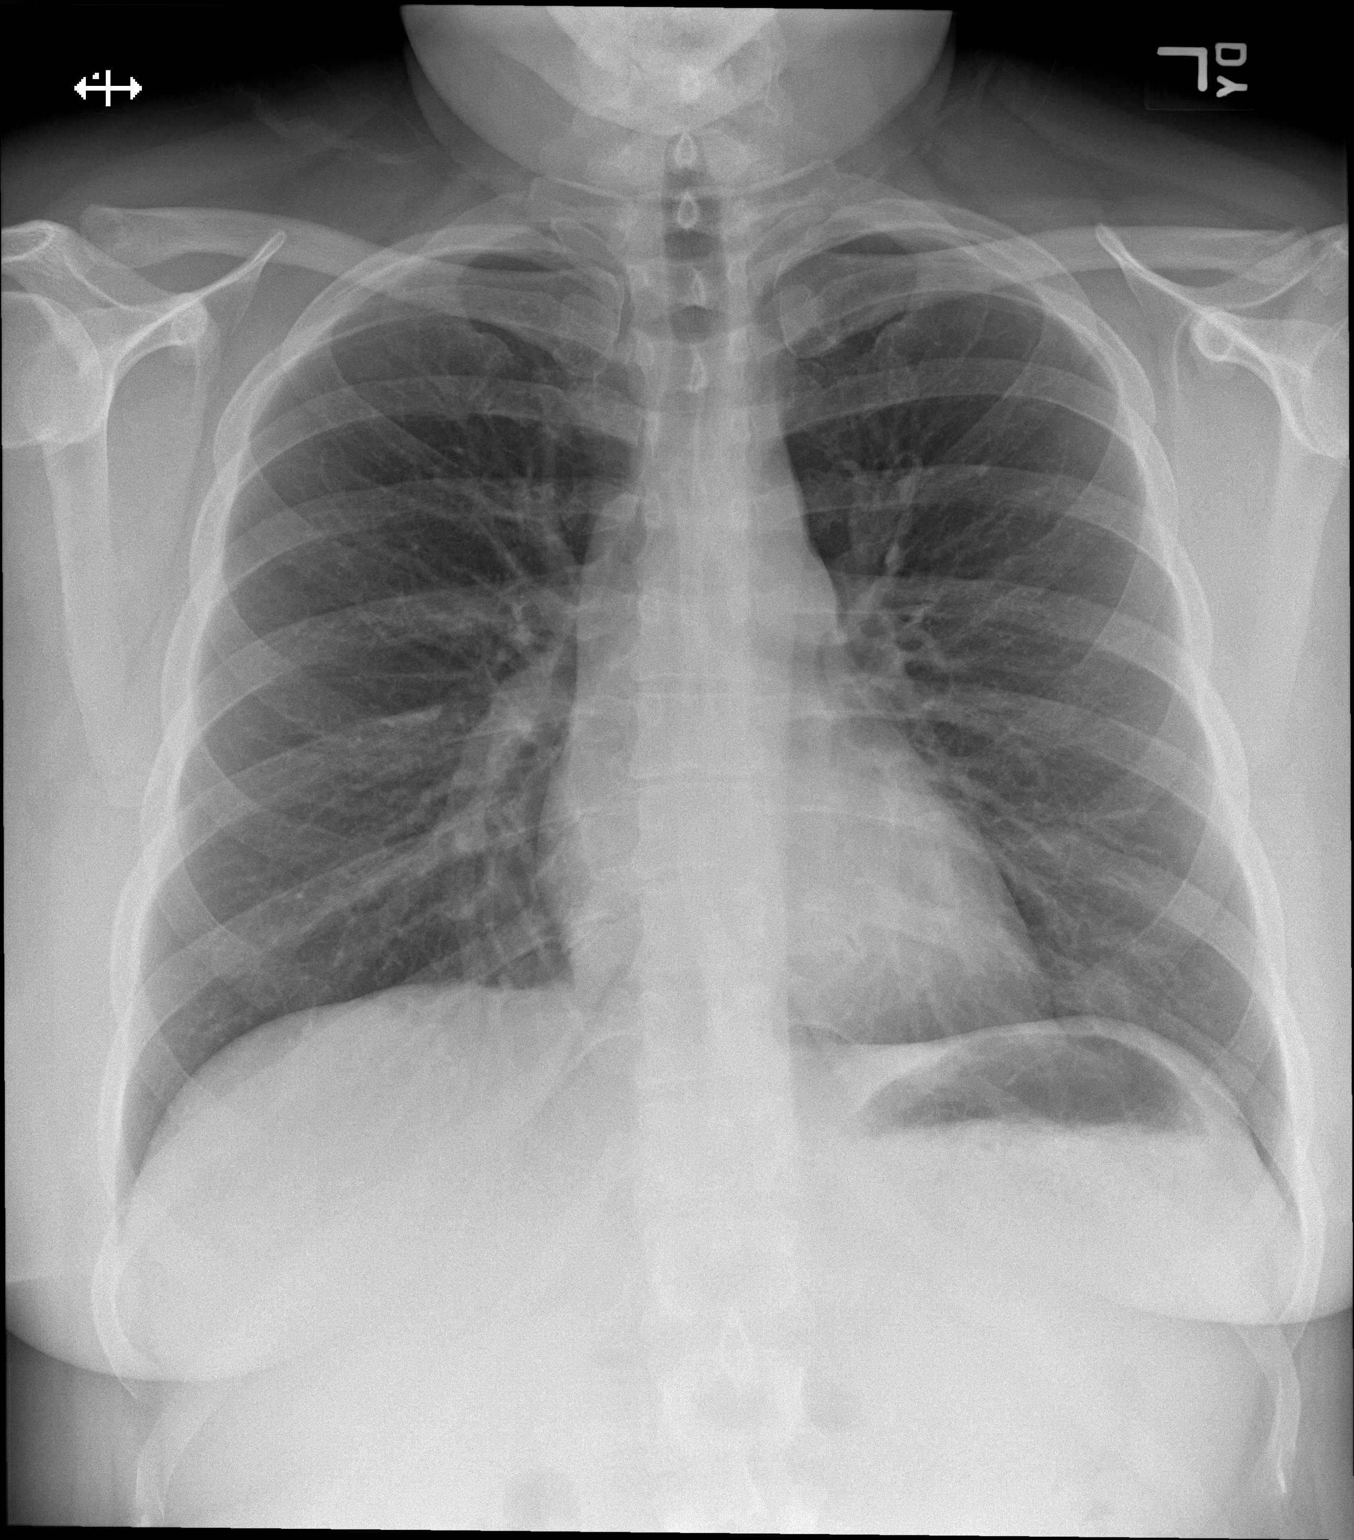
[im 2/2]
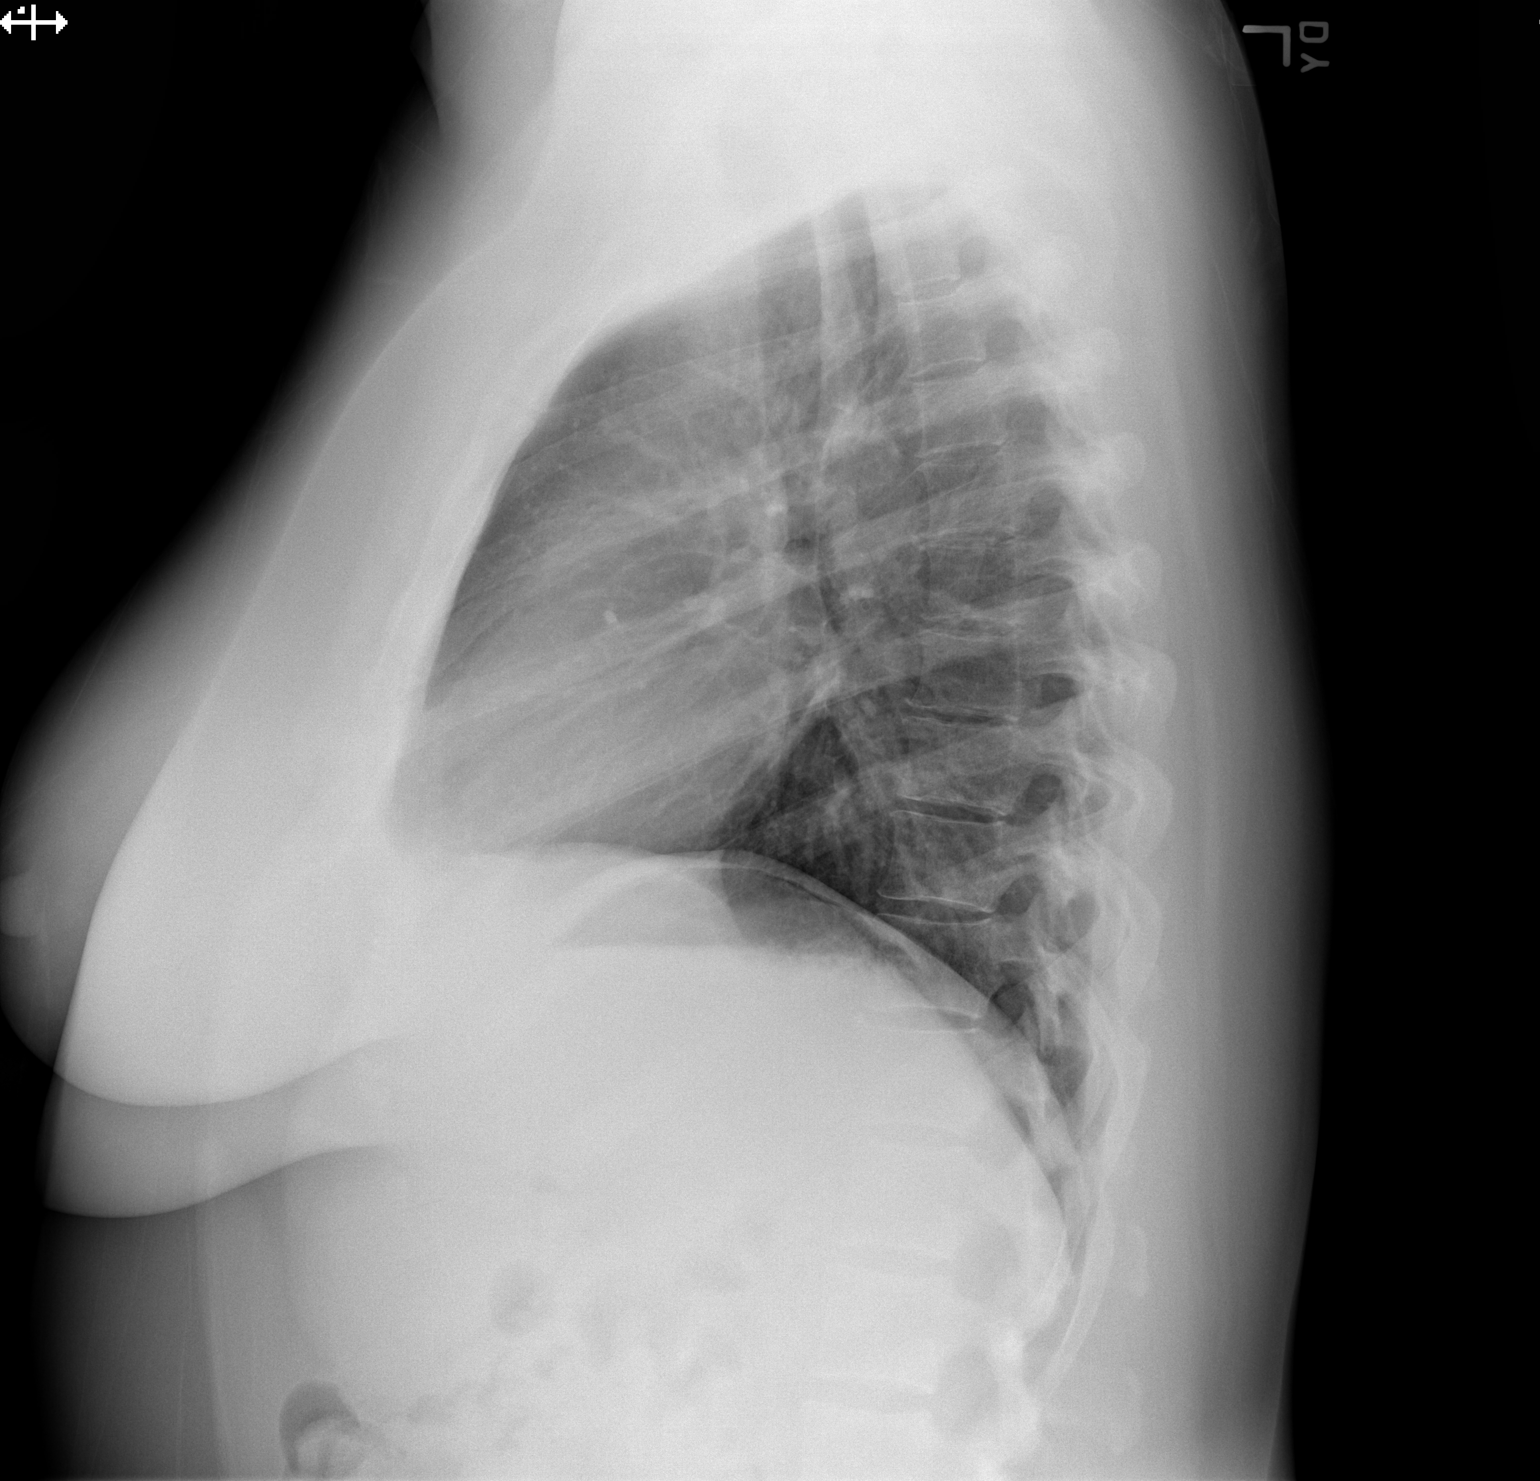

[2 of 2 positions shown; findings below may reference images not displayed]

FINDINGS: The heart size and mediastinal contours are within normal limits.
Both lungs are clear. No pneumothorax or pleural effusion is noted.
The visualized skeletal structures are unremarkable.
IMPRESSION: No active cardiopulmonary disease.

## 2018-08-04 IMAGING — CR DG CHEST 2V
2 series · 2 of 2 positions shown · non-contrast
Comparison: 03/20/2016 chest radiograph

CLINICAL DATA: 40 y/o  F; shortness of breath.

EXAM:
CHEST  2 VIEW

[chest pa]
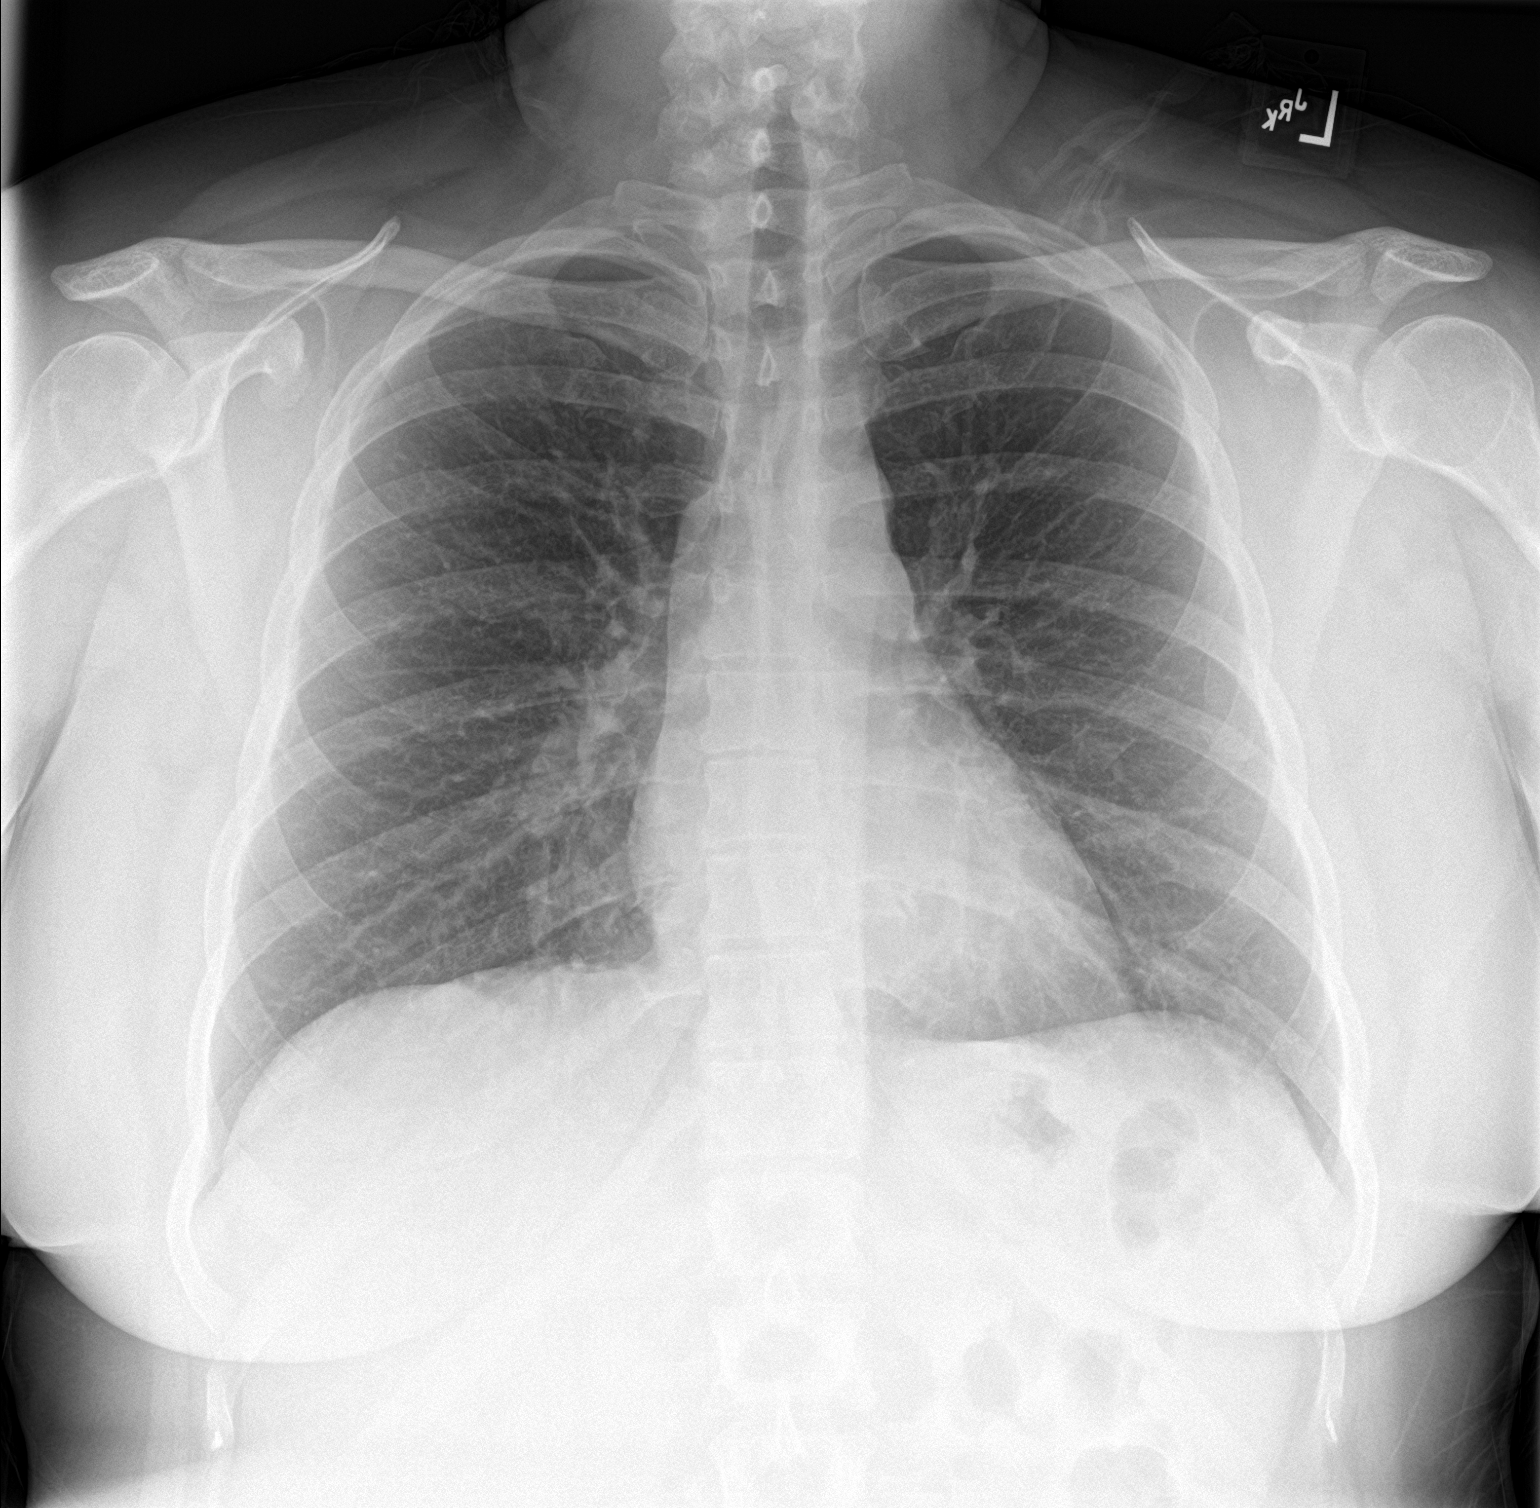

[chest lat]
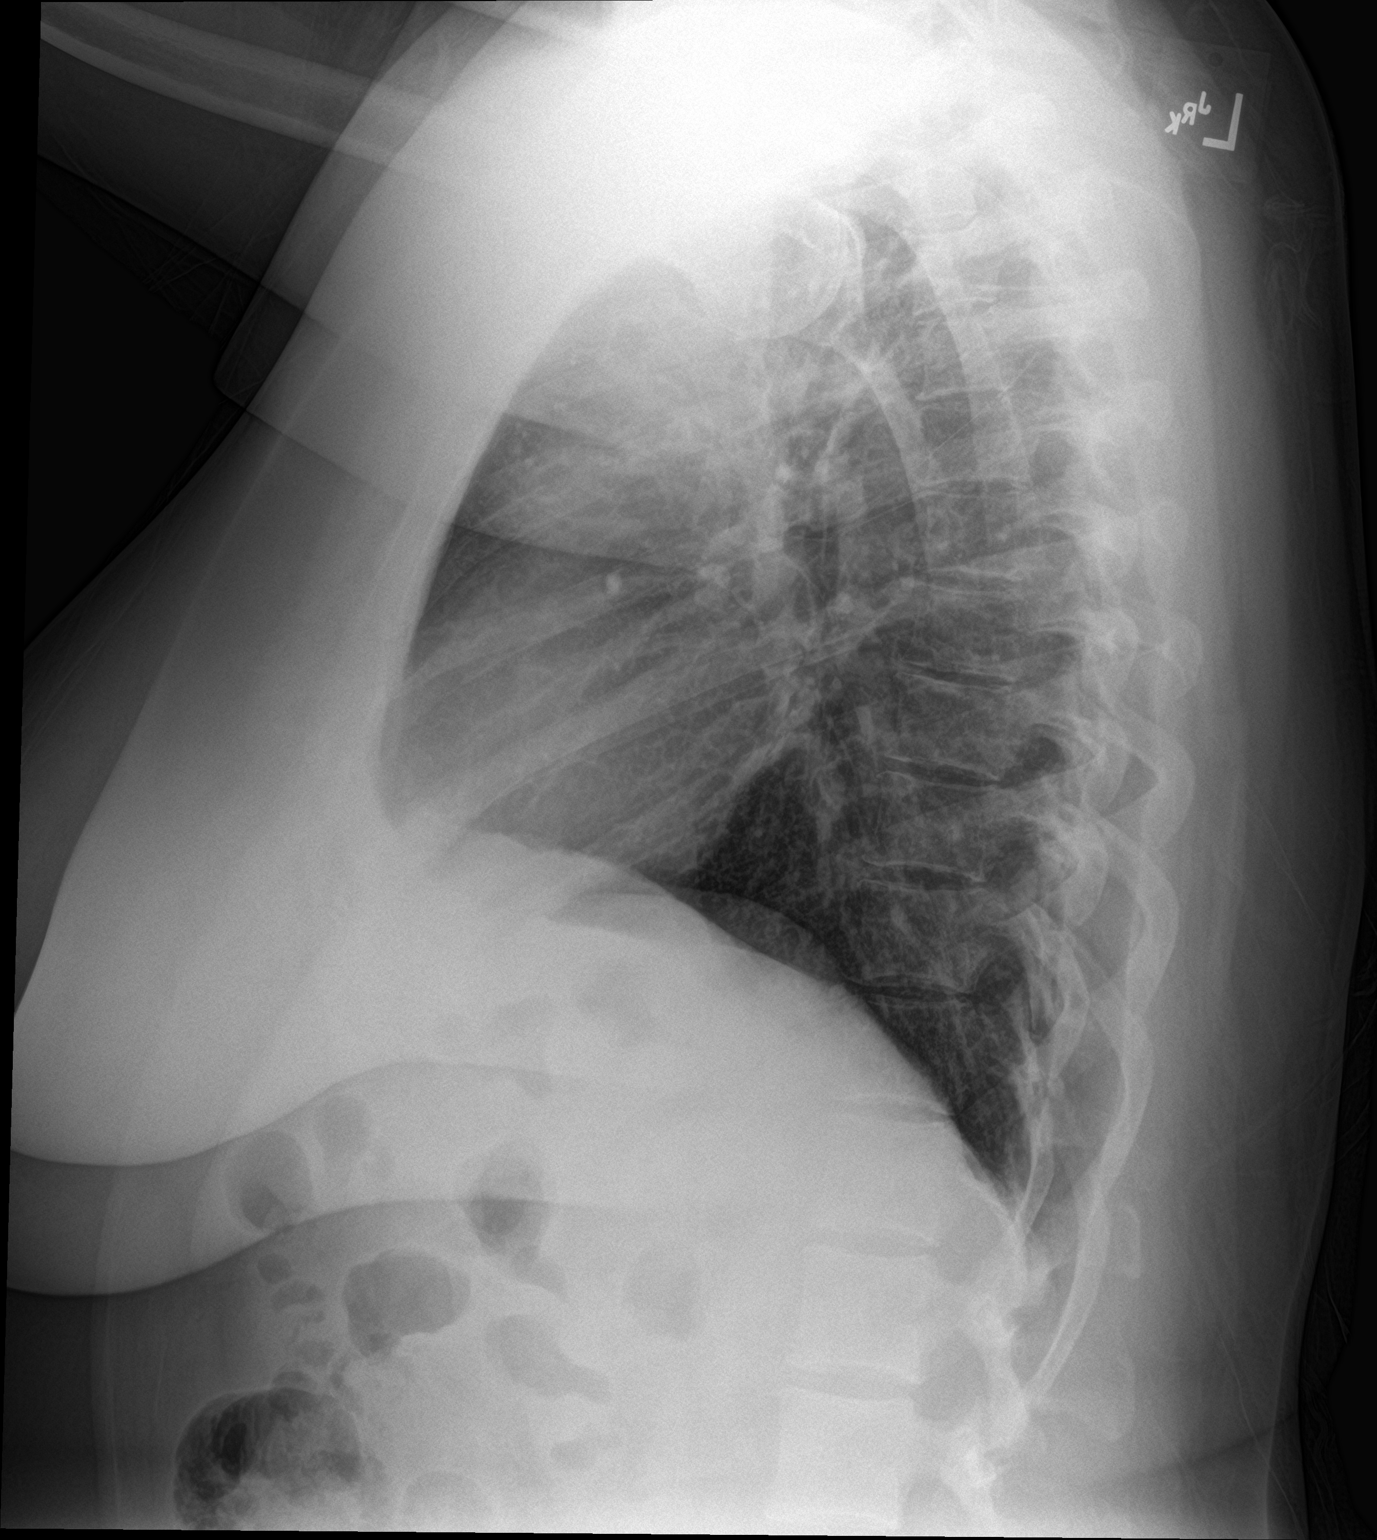

[2 of 2 positions shown; findings below may reference images not displayed]

FINDINGS: Stable heart size and mediastinal contours are within normal limits.
Both lungs are clear. The visualized skeletal structures are
unremarkable.
IMPRESSION: No active cardiopulmonary disease.

By: Martikainen Monthan M.D.

## 2018-10-13 ENCOUNTER — Ambulatory Visit (INDEPENDENT_AMBULATORY_CARE_PROVIDER_SITE_OTHER): Payer: BC Managed Care – PPO

## 2018-10-13 ENCOUNTER — Other Ambulatory Visit: Payer: Self-pay

## 2018-10-13 ENCOUNTER — Ambulatory Visit
Admission: EM | Admit: 2018-10-13 | Discharge: 2018-10-13 | Disposition: A | Discharge status: Home / Self Care | Payer: BC Managed Care – PPO | Attending: Family Medicine | Admitting: Family Medicine

## 2018-10-13 ENCOUNTER — Encounter: Payer: Self-pay | Admitting: Emergency Medicine

## 2018-10-13 DIAGNOSIS — M25571 Pain in right ankle and joints of right foot: Secondary | ICD-10-CM | POA: Diagnosis not present

## 2018-10-13 DIAGNOSIS — M79671 Pain in right foot: Secondary | ICD-10-CM | POA: Diagnosis not present

## 2018-10-13 DIAGNOSIS — W1842XA Slipping, tripping and stumbling without falling due to stepping into hole or opening, initial encounter: Secondary | ICD-10-CM | POA: Diagnosis not present

## 2018-10-13 MED ORDER — MELOXICAM 15 MG PO TABS: 15.0000 mg | ORAL_TABLET | Freq: Every day | ORAL | 0 refills | Status: DC | PRN

## 2018-10-13 NOTE — ED Provider Notes (Signed)
[facility]    CSN: [id number] Arrival date & time: 10/13/18  1254  History   Chief Complaint Chief Complaint  Patient presents with   Foot Pain    right   Ankle Pain    right   HPI  43 year old female presents with the above complaints.  Patient states that she injured her foot and ankle July 26.  She states that she stepped in a divot in the ground.  She states that she twisted her ankle.  Patient states that she rested for several days without improvement.  She continues to have right foot and ankle pain.  She localizes the pain to the medial aspect of the ankle and to the dorsum of the right foot.  No visible swelling.  She states that she initially has some bruising but this is resolved.  No medications tried.  She continues to rest without resolution.  She is concerned given lack of improvement and persistent pain.  With activity.  No relieving factors.  Pain currently 5/10 in severity.  No other complaints.  PMH, Surgical Hx, Family Hx, Social History reviewed and updated as below.  Past Medical History:  Diagnosis Date   ADHD    Back pain    Pneumonia    Pulmonary embolism [facility])     Patient Active Problem List   Diagnosis Date Noted   History of pulmonary embolism 04/14/2016   Pulmonary embolism ([place]) 03/29/2016    Past Surgical History:  Procedure Laterality Date   BACK SURGERY     2016   TONSILLECTOMY      OB History    Gravida  2   Para  2   Term  2   Preterm      AB      Living  2     SAB      TAB      Ectopic      Multiple      Live Births  2          Home Medications    Prior to Admission medications   Medication Sig Start Date End Date Taking? Authorizing Provider  albuterol (PROVENTIL HFA;VENTOLIN HFA) 108 (90 Base) MCG/ACT inhaler Inhale 2 puffs into the lungs every 4 (four) hours as needed for wheezing or shortness of breath.   Yes [provider]  busPIRone (BUSPAR) 10 MG tablet Take 10 mg  by mouth as needed. 05/26/16  Yes [provider]  fluticasone (FLONASE) 50 MCG/ACT nasal spray Place 1 spray into both nostrils daily.   Yes [provider]  lisdexamfetamine (VYVANSE) 70 MG capsule Take 70 mg by mouth daily.   Yes [provider]  loratadine (CLARITIN) 10 MG tablet Take 10 mg by mouth daily.   Yes [provider]  montelukast (SINGULAIR) 10 MG tablet Take 10 mg by mouth daily. 08/06/16  Yes [provider]  Multiple Vitamin (MULTIVITAMIN) tablet Take 1 tablet by mouth daily.   Yes [provider]  vitamin C (ASCORBIC ACID) 500 MG tablet Take 500 mg by mouth daily.   Yes [provider]  aspirin 325 MG tablet Take 325 mg by mouth daily.    [provider]  Dextroamphetamine Sulfate 20 MG TABS Take 20 mg by mouth daily as needed.  04/08/16   [provider]  meloxicam (MOBIC) 15 MG tablet Take 1 tablet (15 mg total) by mouth daily as needed for pain. 10/13/18   Coral Spikes, DO  Family History Family History  Problem Relation Age of Onset   Diabetes Father    Breast cancer Maternal Grandmother    Diabetes Maternal Grandmother    Diabetes Paternal Grandmother    Stroke Paternal Grandmother     Social History Social History   Tobacco Use   Smoking status: Former Smoker   Smokeless tobacco: Never Used   Tobacco comment: quit 8 years ago  Substance Use Topics   Alcohol use: Yes    Comment: occasional wine   Drug use: No     Allergies   Blueberry flavor, Cat hair extract, and Fire ant   Review of Systems Review of Systems  Constitutional: Negative.   Musculoskeletal:       Right foot and ankle pain.   Physical Exam Triage Vital Signs ED Triage Vitals  Enc Vitals Group     BP 10/13/18 1314 106/80     Pulse Rate 10/13/18 1314 91     Resp 10/13/18 1314 16     Temp 10/13/18 1314 98.3 F (36.8 C)     Temp Source 10/13/18 1314 Oral     SpO2 10/13/18 1314 99 %      Weight 10/13/18 1309 205 lb (93 kg)     Height 10/13/18 1309 5\' 3"  (1.6 m)     Head Circumference --      Peak Flow --      Pain Score 10/13/18 1309 5     Pain Loc --      Pain Edu? --      Excl. in [place]? --    Updated Vital Signs BP 106/80 (BP Location: Right Arm)    Pulse 91    Temp 98.3 F (36.8 C) (Oral)    Resp 16    Ht 5\' 3"  (1.6 m)    Wt 93 kg    LMP 10/03/2018 (Exact Date)    SpO2 99%    BMI 36.31 kg/m   Visual Acuity Right Eye Distance:   Left Eye Distance:   Bilateral Distance:    Right Eye Near:   Left Eye Near:    Bilateral Near:     Physical Exam Vitals signs and nursing note reviewed.  Constitutional:      General: She is not in acute distress.    Appearance: Normal appearance. She is obese.  HENT:     Head: Normocephalic and atraumatic.  Eyes:     General:        Right eye: No discharge.        Left eye: No discharge.     Conjunctiva/sclera: Conjunctivae normal.  Cardiovascular:     Rate and Rhythm: Normal rate and regular rhythm.     Heart sounds: No murmur.  Pulmonary:     Effort: Pulmonary effort is normal.     Breath sounds: Normal breath sounds. No wheezing, rhonchi or rales.  Musculoskeletal:     Comments: Right foot and ankle -patient with tenderness on the dorsum of the foot/ankle.  Normal range of motion.  Normal strength.  No apparent swelling.  Neurological:     Mental Status: She is alert.  Psychiatric:        Mood and Affect: Mood normal.        Behavior: Behavior normal.    UC Treatments / Results  Labs (all labs ordered are listed, but only abnormal results are displayed) Labs Reviewed - No data to display  EKG   Radiology Dg Ankle Complete Right  Result Date:  10/13/2018 CLINICAL DATA:  Pain and swelling with injury 1 month prior EXAM: RIGHT ANKLE - COMPLETE 3+ VIEW COMPARISON:  None. FINDINGS: Frontal, oblique, and lateral views were obtained. There is no evident fracture or joint effusion. There is no appreciable joint space  narrowing or erosion. Ankle mortise appears intact. There is pes cavus. IMPRESSION: No evident fracture or appreciable arthropathy. Ankle mortise appears intact. There is pes cavus. Electronically Signed   By: Lowella Grip III M.D.   On: 10/13/2018 14:16   Dg Foot Complete Right  Result Date: 10/13/2018 CLINICAL DATA:  Pain fall following injury 1 month prior EXAM: RIGHT FOOT COMPLETE - 3+ VIEW COMPARISON:  None. FINDINGS: Frontal, lateral common oblique views were obtained. No fracture or dislocation. Joint spaces appear normal. No erosive change. There is pes cavus. IMPRESSION: No fracture or dislocation. No appreciable arthropathy. There is pes cavus. Electronically Signed   By: Lowella Grip III M.D.   On: 10/13/2018 14:17    Procedures Procedures (including critical care time)  Medications Ordered in UC Medications - No data to display  Initial Impression / Assessment and Plan / UC Course  I have reviewed the triage vital signs and the nursing notes.  Pertinent labs & imaging results that were available during my care of the patient were reviewed by me and considered in my medical decision making (see chart for details).    43 year old female presents with right foot and ankle pain.  X-ray negative.  Exam unrevealing.  Meloxicam as needed.  Advised to see podiatry or orthopedics.  Final Clinical Impressions(s) / UC Diagnoses   Final diagnoses:  Acute right ankle pain     Discharge Instructions     Medication as needed.  If persists, consider seeing Orthopedics or Podiatry.  Take care  Dr. Lacinda Axon    ED Prescriptions    Medication Sig Dispense Auth. Provider   meloxicam (MOBIC) 15 MG tablet Take 1 tablet (15 mg total) by mouth daily as needed for pain. 30 tablet Coral Spikes, DO     Controlled Substance Prescriptions [place] Controlled Substance Registry consulted? Not Applicable   Coral Spikes, DO 10/13/18 1434

## 2018-10-13 NOTE — ED Triage Notes (Signed)
Patient c/o right foot and ankle pain that started a month ago due to an injury.

## 2018-10-13 NOTE — Discharge Instructions (Signed)
Medication as needed.  If persists, consider seeing Orthopedics or Podiatry.  Take care  Dr. Lacinda Axon

## 2019-04-06 ENCOUNTER — Other Ambulatory Visit: Payer: Self-pay | Admitting: Pediatrics

## 2019-04-06 DIAGNOSIS — Z1231 Encounter for screening mammogram for malignant neoplasm of breast: Secondary | ICD-10-CM

## 2019-04-10 ENCOUNTER — Ambulatory Visit
Admission: RE | Admit: 2019-04-10 | Discharge: 2019-04-10 | Disposition: A | Discharge status: Home / Self Care | Payer: BC Managed Care – PPO | Source: Ambulatory Visit | Attending: Pediatrics | Admitting: Pediatrics

## 2019-04-10 ENCOUNTER — Other Ambulatory Visit: Payer: Self-pay

## 2019-04-10 DIAGNOSIS — Z1231 Encounter for screening mammogram for malignant neoplasm of breast: Secondary | ICD-10-CM | POA: Diagnosis not present

## 2019-04-20 ENCOUNTER — Other Ambulatory Visit: Payer: Self-pay | Admitting: Pediatrics

## 2019-04-20 DIAGNOSIS — R921 Mammographic calcification found on diagnostic imaging of breast: Secondary | ICD-10-CM

## 2019-04-20 DIAGNOSIS — R928 Other abnormal and inconclusive findings on diagnostic imaging of breast: Secondary | ICD-10-CM

## 2019-04-27 ENCOUNTER — Ambulatory Visit
Admission: RE | Admit: 2019-04-27 | Discharge: 2019-04-27 | Disposition: A | Discharge status: Home / Self Care | Payer: BC Managed Care – PPO | Source: Ambulatory Visit | Attending: Pediatrics | Admitting: Pediatrics

## 2019-04-27 DIAGNOSIS — R921 Mammographic calcification found on diagnostic imaging of breast: Secondary | ICD-10-CM

## 2019-04-27 DIAGNOSIS — R928 Other abnormal and inconclusive findings on diagnostic imaging of breast: Secondary | ICD-10-CM | POA: Insufficient documentation

## 2019-04-30 ENCOUNTER — Other Ambulatory Visit: Payer: Self-pay | Admitting: Pediatrics

## 2019-04-30 DIAGNOSIS — R921 Mammographic calcification found on diagnostic imaging of breast: Secondary | ICD-10-CM

## 2019-04-30 DIAGNOSIS — R928 Other abnormal and inconclusive findings on diagnostic imaging of breast: Secondary | ICD-10-CM

## 2019-05-10 ENCOUNTER — Ambulatory Visit: Payer: BC Managed Care – PPO | Attending: Internal Medicine

## 2019-05-10 DIAGNOSIS — Z23 Encounter for immunization: Secondary | ICD-10-CM

## 2019-05-10 NOTE — Progress Notes (Signed)
   Covid-19 Vaccination Clinic  Name:  Dana Lewis    MRN: [id number] DOB: Oct 06, 1975  05/10/2019  Dana Lewis was observed post Covid-19 immunization for 15 minutes without incident. She was provided with Vaccine Information Sheet and instruction to access the V-Safe system.   Dana Lewis was instructed to call 911 with any severe reactions post vaccine: Marland Kitchen Difficulty breathing  . Swelling of face and throat  . A fast heartbeat  . A bad rash all over body  . Dizziness and weakness   Immunizations Administered    Name Date Dose VIS Date Route   Pfizer COVID-19 Vaccine 05/10/2019  2:12 PM 0.3 mL 01/26/2019 Intramuscular   Manufacturer: [place]   Lot: [id number]   [place]: [id number]

## 2019-06-04 ENCOUNTER — Ambulatory Visit: Payer: BC Managed Care – PPO | Attending: Internal Medicine

## 2019-06-04 DIAGNOSIS — Z23 Encounter for immunization: Secondary | ICD-10-CM

## 2019-06-04 NOTE — Progress Notes (Signed)
   Covid-19 Vaccination Clinic  Name:  Dana Lewis    MRN: [id number] DOB: 1975-09-03  06/04/2019  [name] was observed post Covid-19 immunization for 15 minutes without incident. She was provided with Vaccine Information Sheet and instruction to access the V-Safe system.   [name] was instructed to call 911 with any severe reactions post vaccine: Marland Kitchen Difficulty breathing  . Swelling of face and throat  . A fast heartbeat  . A bad rash all over body  . Dizziness and weakness   Immunizations Administered    Name Date Dose VIS Date Route   Pfizer COVID-19 Vaccine 06/04/2019 11:43 AM 0.3 mL 04/11/2018 Intramuscular   Manufacturer: [place]   Lot: [id number]   [place]: [id number]

## 2019-06-19 ENCOUNTER — Ambulatory Visit (INDEPENDENT_AMBULATORY_CARE_PROVIDER_SITE_OTHER): Payer: BC Managed Care – PPO

## 2019-06-19 ENCOUNTER — Ambulatory Visit
Admission: EM | Admit: 2019-06-19 | Discharge: 2019-06-19 | Disposition: A | Discharge status: Home / Self Care | Payer: BC Managed Care – PPO | Attending: Family Medicine | Admitting: Family Medicine

## 2019-06-19 ENCOUNTER — Other Ambulatory Visit: Payer: Self-pay

## 2019-06-19 DIAGNOSIS — M5442 Lumbago with sciatica, left side: Secondary | ICD-10-CM

## 2019-06-19 DIAGNOSIS — Z9889 Other specified postprocedural states: Secondary | ICD-10-CM

## 2019-06-19 DIAGNOSIS — M5137 Other intervertebral disc degeneration, lumbosacral region: Secondary | ICD-10-CM

## 2019-06-19 MED ORDER — ORPHENADRINE CITRATE 30 MG/ML IJ SOLN
60.0000 mg | Freq: Once | INTRAMUSCULAR | Status: AC
  Administered 2019-06-19: 60 mg via INTRAMUSCULAR

## 2019-06-19 MED ORDER — METHYLPREDNISOLONE 4 MG PO TBPK: ORAL_TABLET | ORAL | 0 refills | Status: DC

## 2019-06-19 MED ORDER — DOCUSATE SODIUM 100 MG PO CAPS: 100.0000 mg | ORAL_CAPSULE | Freq: Two times a day (BID) | ORAL | 0 refills | Status: DC

## 2019-06-19 MED ORDER — HYDROCODONE-ACETAMINOPHEN 5-325 MG PO TABS: 1.0000 | ORAL_TABLET | Freq: Three times a day (TID) | ORAL | 0 refills | Status: DC | PRN

## 2019-06-19 NOTE — ED Triage Notes (Signed)
Patient states that she helped move furniture on Friday and Saturday. States that she has a bulging disc and it has been flared up since Saturday. Patient uncomfortable in any position.

## 2019-06-19 NOTE — ED Provider Notes (Signed)
Dana Lewis, [place]   Name: Dana Lewis DOB: [birth year] MRN: [id number] CSN: [id number] PCP: Barbaraann Boys, MD  Arrival date and time:  06/19/19 1259  Chief Complaint:  Back Pain  NOTE: Prior to seeing the patient today, I have reviewed the triage nursing documentation and vital signs. Clinical staff has updated patient's PMH/PSHx, current medication list, and drug allergies/intolerances to ensure comprehensive history available to assist in medical decision making.   History:   HPI: Dana Lewis is a 44 y.o. female who presents today with complaints of lower back pain that has been bothering her since Saturday (06/16/2019). Patient reports history of back pain. She has had a previous microdiscectomy in 09/2014 performed in [place], [place]; records not available for reviewed during clinic visit today. Patient has diagnostic radiographs of her lumbar spine performed in 2019 following a fall that showed mild multi-level degenerative changes noted at L4-S1. Since then, patient has had intermittent flares of her pain that normally go away with conservative treatment. On Friday (06/15/2019), patient reports that she was helping move some heavy furniture and her "disc slipped". Pain with pain across her lower back with (+) radiation into the posterior aspect of her left lower extremity culminating at the level of the calf. She has not appreciated any weakness in her legs. Patient denies any acute issues with bowel or bladder incontinence. No saddle anesthesia. Patient notes that pain is exacerbated by prolonged sitting/standing, disstance ambulation, bending, torso twisting, and heavy lifting. Patient states, "I have pain over my SI joint". Patient presents to clinic in significant pain that she rates 7/10. Patient reports that she took IBU approximately 2 hours PTA, however it has done little to improve her pain.   Past Medical History:  Diagnosis Date  . ADHD   . Back pain   . Pneumonia   . Pulmonary  embolism [facility])     Past Surgical History:  Procedure Laterality Date  . BACK SURGERY     2016  . TONSILLECTOMY      Family History  Problem Relation Age of Onset  . Diabetes Father   . Breast cancer Maternal Grandmother   . Diabetes Maternal Grandmother   . Diabetes Paternal Grandmother   . Stroke Paternal Grandmother     Social History   Tobacco Use  . Smoking status: Former Research scientist (life sciences)  . Smokeless tobacco: Never Used  . Tobacco comment: quit 8 years ago  Substance Use Topics  . Alcohol use: Yes    Comment: occasional wine  . Drug use: No    Patient Active Problem List   Diagnosis Date Noted  . History of pulmonary embolism 04/14/2016  . Pulmonary embolism ([place]) 03/29/2016    Home Medications:    Current Meds  Medication Sig  . albuterol (PROVENTIL HFA;VENTOLIN HFA) 108 (90 Base) MCG/ACT inhaler Inhale 2 puffs into the lungs every 4 (four) hours as needed for wheezing or shortness of breath.  Marland Kitchen aspirin 325 MG tablet Take 325 mg by mouth daily.  . busPIRone (BUSPAR) 10 MG tablet Take 10 mg by mouth as needed.  . fluticasone (FLONASE) 50 MCG/ACT nasal spray Place 1 spray into both nostrils daily.  Marland Kitchen lisdexamfetamine (VYVANSE) 70 MG capsule Take 70 mg by mouth daily.  Marland Kitchen loratadine (CLARITIN) 10 MG tablet Take 10 mg by mouth daily.  . montelukast (SINGULAIR) 10 MG tablet Take 10 mg by mouth daily.  . Multiple Vitamin (MULTIVITAMIN) tablet Take 1 tablet by mouth daily.  . vitamin C (ASCORBIC ACID) 500 MG  tablet Take 500 mg by mouth daily.    Allergies:   Blueberry flavor, Cat hair extract, and Fire ant  Review of Systems (ROS):  Review of systems NEGATIVE unless otherwise noted in narrative H&P section.   Vital Signs: Today's Vitals   06/19/19 1325 06/19/19 1328  BP: 134/82   Pulse: 78   Resp: 18   Temp: 98.5 F (36.9 C)   TempSrc: Oral   SpO2: 100%   Weight:  210 lb (95.3 kg)  Height:  5\' 3"  (1.6 m)  PainSc:  7     Physical Exam: Physical Exam    Constitutional: She is oriented to person, place, and time. She appears distressed (2/2 acute back pain).  HENT:  Head: Normocephalic and atraumatic.  Eyes: Pupils are equal, round, and reactive to light.  Cardiovascular: Normal rate, regular rhythm and intact distal pulses. Exam reveals friction rub.  Pulmonary/Chest: Effort normal and breath sounds normal.  Musculoskeletal:     Cervical back: Normal range of motion and neck supple.     Lumbar back: Pain and tenderness present. No swelling, deformity or spasms. Normal pulse.     Comments: overlying para-spinous muscles and LEFT SI joint.   Neurological: She is alert and oriented to person, place, and time. She has normal sensation, normal strength and normal reflexes. She has an abnormal Straight Leg Raise Test. Gait (2/2 acute pain) abnormal.  Skin: Skin is warm and dry. No rash noted. She is not diaphoretic.  Psychiatric: Memory, affect and judgment normal. Her mood appears anxious.  Nursing note and vitals reviewed.   Urgent Care Treatments / Results:   Orders Placed This Encounter  Procedures  . DG Lumbar Spine Complete    LABS: PLEASE NOTE: all labs that were ordered this encounter are listed, however only abnormal results are displayed. Labs Reviewed - No data to display  EKG: -None  RADIOLOGY: DG Lumbar Spine Complete  Result Date: 06/19/2019 CLINICAL DATA:  Pain after moving furniture. EXAM: LUMBAR SPINE - COMPLETE 4+ VIEW COMPARISON:  09/19/2017. FINDINGS: Lumbar spine numbered the lowest segmented appearing lumbar shaped vertebrae on lateral view as L5. Paraspinal soft tissues are unremarkable. L5-S1 disc degeneration. No acute bony abnormality identified. No evidence of fracture. Large amount of stool noted throughout the colon. Pelvic calcifications consistent phleboliths. IMPRESSION: L5-S1 disc degeneration.  No acute abnormality identified. Electronically Signed   By: Marcello Moores  Register   On: 06/19/2019 14:30     PROCEDURES: Procedures  MEDICATIONS RECEIVED THIS VISIT: Medications  orphenadrine (NORFLEX) injection 60 mg (60 mg Intramuscular Given 06/19/19 1353)    PERTINENT CLINICAL COURSE NOTES/UPDATES:   Initial Impression / Assessment and Plan / Urgent Care Course:  Pertinent labs & imaging results that were available during my care of the patient were personally reviewed by me and considered in my medical decision making (see lab/imaging section of note for values and interpretations).  Dana Lewis is a 44 y.o. female who presents to [facility] today with complaints of Back Pain  Patient is well appearing overall in clinic today. She does not appear to be in any acute distress. Presenting symptoms (see HPI) and exam as documented above. Patient self medicated with IBU 2 hours PTA. She was treated with orphenadrine 60 mg IM in clinic. Diagnostic radiographs of the lumbar spine revealed L5-S1 disc degeneration. Given her surgical history, patient needs to be seen by neurosurgery for further evaluation. She needs to have a repeat MRI to further assess her lower back pain.  Referral sent to [facility] neurosurgery; asking for today's notes and past surgical notes. In the interim, treating with Medrol dose pack and Norco for pain. Dicussed supportive care; rest,  Moist heat, and daily stretching. I have reviewed the follow up and strict return precautions for any new or worsening symptoms. Patient is aware of symptoms that would be deemed urgent/emergent, and would thus require further evaluation either here or in the emergency department. At the time of discharge, she verbalized understanding and consent with the discharge plan as it was reviewed with her. All questions were fielded by provider and/or clinic staff prior to patient discharge.    Final Clinical Impressions / Urgent Care Diagnoses:   Final diagnoses:  Acute bilateral low back pain with left-sided sciatica  DDD  (degenerative disc disease), lumbosacral  Previous back surgery    New Prescriptions:  [place] Controlled Substance Registry consulted? Yes, I have consulted the [place] Controlled Substances Registry for this patient, and feel the risk/benefit ratio today is favorable for proceeding with this prescription for a controlled substance.  . Discussed use of controlled substance medication to treat her acute pain.  o Reviewed  STOP Act regulations  o Clinic does not refill controlled substances over the phone without face to face evaluation.  . Safety precautions reviewed.  o Medications should not be bitten, chewed, sold, or taken with alcohol.  o Avoid use while working, driving, or operating heavy machinery.  o Side effects associated with the use of this particular medication reviewed. - Patient understands that this medication can cause CNS depression, increase her risk of falls, and even lead to overdose that may result in death, if used outside of the parameters that she and I discussed.  With all of this in mind, she knowingly accepts the risks and responsibilities associated with intended course of treatment, and elects to responsibly proceed as discussed.  Meds ordered this encounter  Medications  . orphenadrine (NORFLEX) injection 60 mg  . methylPREDNISolone (MEDROL DOSEPAK) 4 MG TBPK tablet    Sig: Take by mouth daily - taper daily dose per package instructions.    Dispense:  21 tablet    Refill:  0  . HYDROcodone-acetaminophen (NORCO) 5-325 MG tablet    Sig: Take 1 tablet by mouth 3 (three) times daily as needed for moderate pain.    Dispense:  12 tablet    Refill:  0    Recommended Follow up Care:  Patient encouraged to follow up with the following provider within the specified time frame, or sooner as dictated by the severity of her symptoms. As always, she was instructed that for any urgent/emergent care needs, she should seek care either here or in the emergency department for more  immediate evaluation.  Follow-up Information    Schedule an appointment as soon as possible for a visit  with Deetta Perla, MD.   Specialty: Neurosurgery Contact information: [place] [address] [phone]         NOTE: This note was prepared using Dragon dictation software along with smaller phrase technology. Despite my best ability to proofread, there is the potential that transcriptional errors may still occur from this process, and are completely unintentional.    Karen Kitchens, NP 06/19/19 1527

## 2019-06-19 NOTE — Discharge Instructions (Addendum)
It was very nice seeing you today in clinic. Thank you for entrusting me with your care.   Rest and apply moist heat to your lower back. Use steroids as prescribed; avoid ibuprofen while on steroids as it can make your blood pressure go up. Use Tylenol if you do not need the Norco... just dont take them together as the Norco has Tylenol in it.   Make arrangements to follow up with neurosurgery for ongoing care and evaluation. Need a MRI of your back. I have provided you the name and office contact information for an excellent local provider.  If your symptoms/condition worsens, please seek follow up care either here or in the ER. Please remember, our [place] providers are "right here with you" when you need [place].   Again, it was my pleasure to take care of you today. Thank you for choosing our clinic. I hope that you start to feel better quickly.   Honor Loh, MSN, APRN, FNP-C, CEN Advanced Practice Provider [facility]

## 2019-06-27 ENCOUNTER — Other Ambulatory Visit: Payer: Self-pay | Admitting: Nurse Practitioner

## 2019-06-27 DIAGNOSIS — M5416 Radiculopathy, lumbar region: Secondary | ICD-10-CM

## 2019-07-04 ENCOUNTER — Inpatient Hospital Stay
Admission: EM | Admit: 2019-07-04 | Discharge: 2019-07-09 | DRG: 548 | Disposition: A | Discharge status: Home Health | Payer: BC Managed Care – PPO | Attending: Internal Medicine | Admitting: Internal Medicine

## 2019-07-04 ENCOUNTER — Ambulatory Visit
Admission: RE | Admit: 2019-07-04 | Discharge: 2019-07-04 | Disposition: A | Discharge status: Home / Self Care | Payer: Self-pay | Source: Ambulatory Visit | Attending: Nurse Practitioner | Admitting: Nurse Practitioner

## 2019-07-04 ENCOUNTER — Other Ambulatory Visit: Payer: Self-pay | Admitting: Nurse Practitioner

## 2019-07-04 ENCOUNTER — Other Ambulatory Visit: Payer: Self-pay

## 2019-07-04 DIAGNOSIS — Z6837 Body mass index (BMI) 37.0-37.9, adult: Secondary | ICD-10-CM | POA: Diagnosis not present

## 2019-07-04 DIAGNOSIS — G061 Intraspinal abscess and granuloma: Secondary | ICD-10-CM | POA: Diagnosis present

## 2019-07-04 DIAGNOSIS — F419 Anxiety disorder, unspecified: Secondary | ICD-10-CM | POA: Diagnosis present

## 2019-07-04 DIAGNOSIS — Z20822 Contact with and (suspected) exposure to covid-19: Secondary | ICD-10-CM | POA: Diagnosis present

## 2019-07-04 DIAGNOSIS — A4901 Methicillin susceptible Staphylococcus aureus infection, unspecified site: Secondary | ICD-10-CM | POA: Diagnosis not present

## 2019-07-04 DIAGNOSIS — M0008 Staphylococcal arthritis, vertebrae: Secondary | ICD-10-CM | POA: Diagnosis not present

## 2019-07-04 DIAGNOSIS — D473 Essential (hemorrhagic) thrombocythemia: Secondary | ICD-10-CM

## 2019-07-04 DIAGNOSIS — R7989 Other specified abnormal findings of blood chemistry: Secondary | ICD-10-CM | POA: Diagnosis present

## 2019-07-04 DIAGNOSIS — D75839 Thrombocytosis, unspecified: Secondary | ICD-10-CM

## 2019-07-04 DIAGNOSIS — M4620 Osteomyelitis of vertebra, site unspecified: Secondary | ICD-10-CM | POA: Diagnosis present

## 2019-07-04 DIAGNOSIS — M4650 Other infective spondylopathies, site unspecified: Secondary | ICD-10-CM

## 2019-07-04 DIAGNOSIS — Z7982 Long term (current) use of aspirin: Secondary | ICD-10-CM | POA: Diagnosis not present

## 2019-07-04 DIAGNOSIS — Z9109 Other allergy status, other than to drugs and biological substances: Secondary | ICD-10-CM | POA: Diagnosis not present

## 2019-07-04 DIAGNOSIS — Z79899 Other long term (current) drug therapy: Secondary | ICD-10-CM | POA: Diagnosis not present

## 2019-07-04 DIAGNOSIS — F909 Attention-deficit hyperactivity disorder, unspecified type: Secondary | ICD-10-CM

## 2019-07-04 DIAGNOSIS — Z91018 Allergy to other foods: Secondary | ICD-10-CM | POA: Diagnosis not present

## 2019-07-04 DIAGNOSIS — M0000 Staphylococcal arthritis, unspecified joint: Secondary | ICD-10-CM | POA: Diagnosis present

## 2019-07-04 DIAGNOSIS — M545 Low back pain, unspecified: Secondary | ICD-10-CM

## 2019-07-04 DIAGNOSIS — Z86711 Personal history of pulmonary embolism: Secondary | ICD-10-CM

## 2019-07-04 DIAGNOSIS — J3081 Allergic rhinitis due to animal (cat) (dog) hair and dander: Secondary | ICD-10-CM | POA: Diagnosis present

## 2019-07-04 DIAGNOSIS — Z87891 Personal history of nicotine dependence: Secondary | ICD-10-CM

## 2019-07-04 DIAGNOSIS — L989 Disorder of the skin and subcutaneous tissue, unspecified: Secondary | ICD-10-CM | POA: Diagnosis present

## 2019-07-04 LAB — CBC WITH DIFFERENTIAL/PLATELET
Abs Immature Granulocytes: 0.04 10*3/uL (ref 0.00–0.07)
Basophils Absolute: 0.1 10*3/uL (ref 0.0–0.1)
Basophils Relative: 1 %
Eosinophils Absolute: 0.4 10*3/uL (ref 0.0–0.5)
Eosinophils Relative: 3 %
HCT: 41.6 % (ref 36.0–46.0)
Hemoglobin: 13.5 g/dL (ref 12.0–15.0)
Immature Granulocytes: 0 %
Lymphocytes Relative: 25 %
Lymphs Abs: 3.4 10*3/uL (ref 0.7–4.0)
MCH: 29.2 pg (ref 26.0–34.0)
MCHC: 32.5 g/dL (ref 30.0–36.0)
MCV: 90 fL (ref 80.0–100.0)
Monocytes Absolute: 0.7 10*3/uL (ref 0.1–1.0)
Monocytes Relative: 5 %
Neutro Abs: 8.8 10*3/uL — ABNORMAL HIGH (ref 1.7–7.7)
Neutrophils Relative %: 66 %
Platelets: 591 10*3/uL — ABNORMAL HIGH (ref 150–400)
RBC: 4.62 MIL/uL (ref 3.87–5.11)
RDW: 12.1 % (ref 11.5–15.5)
WBC: 13.4 10*3/uL — ABNORMAL HIGH (ref 4.0–10.5)
nRBC: 0 % (ref 0.0–0.2)

## 2019-07-04 LAB — COMPREHENSIVE METABOLIC PANEL
ALT: 32 U/L (ref 0–44)
AST: 15 U/L (ref 15–41)
Albumin: 3.4 g/dL — ABNORMAL LOW (ref 3.5–5.0)
Alkaline Phosphatase: 73 U/L (ref 38–126)
Anion gap: 9 (ref 5–15)
BUN: 12 mg/dL (ref 6–20)
CO2: 25 mmol/L (ref 22–32)
Calcium: 9.3 mg/dL (ref 8.9–10.3)
Chloride: 103 mmol/L (ref 98–111)
Creatinine, Ser: 0.63 mg/dL (ref 0.44–1.00)
GFR calc Af Amer: >60 mL/min (ref >60)
GFR calc non Af Amer: >60 mL/min (ref >60)
Glucose, Bld: 90 mg/dL (ref 70–99)
Potassium: 4.2 mmol/L (ref 3.5–5.1)
Sodium: 137 mmol/L (ref 135–145)
Total Bilirubin: 0.5 mg/dL (ref 0.3–1.2)
Total Protein: 7.5 g/dL (ref 6.5–8.1)

## 2019-07-04 LAB — SARS CORONAVIRUS 2 BY RT PCR (HOSPITAL ORDER, PERFORMED IN ~~LOC~~ HOSPITAL LAB): SARS Coronavirus 2: NEGATIVE

## 2019-07-04 LAB — LACTIC ACID, PLASMA: Lactic Acid, Venous: 1.4 mmol/L (ref 0.5–1.9)

## 2019-07-04 MED ORDER — SODIUM CHLORIDE 0.9% FLUSH: 3.0000 mL | Freq: Once | INTRAVENOUS | Status: DC

## 2019-07-04 MED ORDER — BUSPIRONE HCL 10 MG PO TABS
10.0000 mg | ORAL_TABLET | Freq: Two times a day (BID) | ORAL | Status: DC
  Administered 2019-07-04 – 2019-07-09 (×9): 10 mg via ORAL
  Filled 2019-07-04 (×9): qty 1

## 2019-07-04 MED ORDER — HYDROMORPHONE HCL 1 MG/ML IJ SOLN
1.0000 mg | Freq: Once | INTRAMUSCULAR | Status: AC
  Administered 2019-07-04: 1 mg via INTRAVENOUS
  Filled 2019-07-04: qty 1

## 2019-07-04 MED ORDER — ASPIRIN EC 325 MG PO TBEC
325.0000 mg | DELAYED_RELEASE_TABLET | Freq: Every day | ORAL | Status: DC
  Administered 2019-07-06 – 2019-07-09 (×4): 325 mg via ORAL
  Filled 2019-07-04 (×4): qty 1

## 2019-07-04 MED ORDER — MONTELUKAST SODIUM 10 MG PO TABS
10.0000 mg | ORAL_TABLET | Freq: Every day | ORAL | Status: DC
  Administered 2019-07-06 – 2019-07-09 (×4): 10 mg via ORAL
  Filled 2019-07-04 (×5): qty 1

## 2019-07-04 MED ORDER — OXYCODONE-ACETAMINOPHEN 7.5-325 MG PO TABS
1.0000 | ORAL_TABLET | ORAL | Status: DC | PRN
  Administered 2019-07-04 – 2019-07-08 (×14): 1 via ORAL
  Filled 2019-07-04 (×14): qty 1

## 2019-07-04 MED ORDER — MORPHINE SULFATE (PF) 2 MG/ML IV SOLN
2.0000 mg | INTRAVENOUS | Status: DC | PRN
  Administered 2019-07-04 – 2019-07-06 (×4): 2 mg via INTRAVENOUS
  Filled 2019-07-04 (×4): qty 1

## 2019-07-04 MED ORDER — FLUTICASONE PROPIONATE 50 MCG/ACT NA SUSP
1.0000 | Freq: Every day | NASAL | Status: DC | PRN
  Filled 2019-07-04: qty 16

## 2019-07-04 MED ORDER — DOCUSATE SODIUM 100 MG PO CAPS
100.0000 mg | ORAL_CAPSULE | Freq: Two times a day (BID) | ORAL | Status: DC
  Administered 2019-07-04 – 2019-07-09 (×8): 100 mg via ORAL
  Filled 2019-07-04 (×8): qty 1

## 2019-07-04 NOTE — ED Triage Notes (Addendum)
Pt comes via POV from home with c/o infection in her spine. Pt states they can't fit her into Radiology so they sent her to ER. Pt states she has already had blood drawn. Pt refusing blood draw.  Pt states pain in lower back. Pt appears very uncomfortably.

## 2019-07-04 NOTE — ED Triage Notes (Signed)
Pt sent over from [facility]. Per provider, pt with c/o back pain. A MRI was completed and pt has what appears to be an abscess and will require admission.

## 2019-07-04 NOTE — Consult Note (Signed)
Referring Physician:  No referring provider defined for this encounter.  Primary Physician:  Dana Boys, MD  Chief Complaint:  Lumbar radiculopathy with imaging concerning for L5-S1 septic arthropathy  History of Present Illness: Dana Lewis is a 44 y.o. female who is know to the Neurosurgery clinic. Pt was seen in clinic on 06/26/2019 for c/o Left lumbar radiculopathy, without evidence of cauda equina or weakness.  She had no evidence of any systemic infection (chills, fevers) at that time.  She underwent a lumbar MRI yesterday evening and this morning we were notified of the results which were concerning for possible L5-S1 septic arthropathy.   Per our recommendations, she presented to [facility] laboratory for ESR, CRP, and CBC w differential.  Her labs are notable at [facility] for WBC of 15.0, Neutrophils of 10.25, ESR, CRP pending.   Given her MRI results as well as her inflammatory markers, we recommended she present to the ED for further evaluation.   Since her appointment she denies any bowel or bladder dysfunction, new weakness, or saddle anesthesia.    Review of Systems:  A 10 point review of systems is negative, except for the pertinent positives and negatives detailed in the HPI.  Past Medical History: Past Medical History:  Diagnosis Date  . ADHD   . Back pain   . Pneumonia   . Pulmonary embolism [facility])     Past Surgical History: Past Surgical History:  Procedure Laterality Date  . BACK SURGERY     2016  . TONSILLECTOMY      Allergies: Allergies as of 07/04/2019 - Review Complete 07/04/2019  Allergen Reaction Noted  . Blueberry flavor Anaphylaxis 03/29/2016  . Cat hair extract Dermatitis, Itching, Shortness Of Breath, and Swelling March 17, 1975  . 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  morphine 2 MG/ML injection 2 mg, 2 mg, Intravenous, Q3H PRN, Tu, Ching T, DO .   oxyCODONE-acetaminophen (PERCOCET) 7.5-325 MG per tablet 1 tablet, 1 tablet, Oral, Q4H PRN, Tu, Ching T, DO  Current Outpatient Medications:  .  aspirin 325 MG tablet, Take 325 mg by mouth daily., Disp: , Rfl:  .  busPIRone (BUSPAR) 10 MG tablet, Take 10 mg by mouth 2 (two) times daily. , Disp: , Rfl: 2 .  dextroamphetamine (DEXTROSTAT) 10 MG tablet, Take 10 mg by mouth 3 (three) times daily., Disp: , Rfl:  .  docusate sodium (COLACE) 100 MG capsule, Take 1 capsule (100 mg total) by mouth every 12 (twelve) hours., Disp: 60 capsule, Rfl: 0 .  gabapentin (NEURONTIN) 100 MG capsule, , Disp: , Rfl:  .  HYDROcodone-acetaminophen (NORCO) 5-325 MG tablet, Take 1 tablet by mouth 3 (three) times daily as needed for moderate pain., Disp: 12 tablet, Rfl: 0 .  lisdexamfetamine (VYVANSE) 70 MG capsule, Take 70 mg by mouth daily., Disp: , Rfl:  .  montelukast (SINGULAIR) 10 MG tablet, Take 10 mg by mouth daily., Disp: , Rfl: 11 .  oxyCODONE (OXY IR/ROXICODONE) 5 MG immediate release tablet, Take 5 mg by mouth 2 (two) times daily as needed., Disp: , Rfl:  .  albuterol (PROVENTIL HFA;VENTOLIN HFA) 108 (90 Base) MCG/ACT inhaler, Inhale 2 puffs into the lungs every 4 (four) hours as needed for wheezing or shortness of breath., Disp: , Rfl:  .  fluticasone (FLONASE) 50 MCG/ACT nasal spray, Place 1 spray into both nostrils daily., Disp: , Rfl:  .  Multiple Vitamin (MULTIVITAMIN) tablet, Take 1 tablet by mouth  daily., Disp: , Rfl:  .  vitamin C (ASCORBIC ACID) 500 MG tablet, Take 500 mg by mouth daily., Disp: , Rfl:    Social History: Social History   Tobacco Use  . Smoking status: Former Research scientist (life sciences)  . Smokeless tobacco: Never Used  . Tobacco comment: quit 8 years ago  Substance Use Topics  . Alcohol use: Yes    Comment: occasional wine  . Drug use: No    Family Medical History: Family History  Problem Relation Age of Onset  . Diabetes Father   . Breast cancer Maternal Grandmother   . Diabetes Maternal  Grandmother   . Diabetes Paternal Grandmother   . Stroke Paternal Grandmother     Physical Examination: Vitals:   07/04/19 1730 07/04/19 1815  BP: (!) 153/108 130/80  Pulse: 88 92  Resp: 17   Temp:    SpO2: 97% 99%     General: Patient is well developed, well nourished, in moderate pain Psychiatric: Patient is anxious.  Respiratory: Patient is breathing without any difficulty.  Extremities: No edema.  Vascular: Palpable pulses in dorsal pedal vessels.  Skin:   On exposed skin, there are no abnormal skin lesions.  NEUROLOGICAL:  General: In no acute distress.   Awake, alert, oriented to person, place, and time.  Pupils equal round and reactive to light.  Facial tone is symmetric.  Tongue protrusion is midline.  There is no pronator drift.  ROM of spine: Deferred due to pain  Palpation of spine: Nontender.    Strength:  Side Iliopsoas Quads Hamstring PF DF EHL  R '5 5 5 5 5 5  ' L '5 5 5 5 5 5    ' Bilateral lower extremity sensation is intact to light touch.  Imaging: Lumbar spine MRI from 07/03/2019: Left facet joint edema and contiguous paraspinal edema appearance suspicious for septic arthropathy (please see full report)    Assessment and Plan: [name] is a pleasant 44 y.o. female with c/f septic arthropathy  - Please admit to medicine with plan for IR aspiration 07/05/2019 (spoke to IR MD) - NPO after MN in preparation for IR aspiration - Please avoid antibiotics until after aspiration unless septic appearing - Recommend blood cultures. ESR, CRP completed at [place] consult to ID for further evaluation and treatment   We will continue to follow this patient along with you. Please contact us should you have any further questions.   Lonell Face, NP Department of Neurosurgery  Pager [phone]

## 2019-07-04 NOTE — ED Notes (Signed)
One set of cultures sent and full rainbow to lab

## 2019-07-04 NOTE — Progress Notes (Addendum)
Pt was seen in clinic on 06/26/2019 for c/o Left lumbar radiculopathy, without evidence of cauda equina or weakness.  She had no evidence of any systemic infection (chills, fevers) at that time.  She underwent a lumbar MRI yesterday evening and this morning we were notified of the results which were concerning for possible L5-S1 septic arthropathy. We called Dana Lewis and had the pt come to clinic for ESR, CRP, and CBC w differential. We also spoke to IR regarding an aspiration of the joint. Due to the concern for infection, we advised her to present to the ED for admission to medicine. Report given to Dr. Blaine Hamper, [facility] hospital medicine. 1400: Discussed with IR. Tentative plan for IR aspiration tomorrow am.

## 2019-07-04 NOTE — H&P (Signed)
History and Physical    Dana Lewis FEO:[id number] DOB: [birth year] DOA: 07/04/2019  PCP: Barbaraann Boys, MD  Patient coming from: Husband at bedside  I have personally briefly reviewed patient's old medical records in [place]  Chief Complaint: left lower back pain  HPI: Dana Lewis is a 44 y.o. female with medical history significant for Hx of PE, ADHD, anxiety, and hx of microdiscectomy who presents with concerns of L5- S1 septic arthropathy.  She has been having left sided lumbar pain since beginning of May. Pain started after she lifted something heavy. Has been having sharp shooting pain down her left leg worse with movement. Has been having numbness and tingling. No lower extremity weakness. No saddle anesthesia. No bowel or bladder incontinence. No fever. Has been taking Vicodin and ibuprofen for pain and some nausea due to the medications. Has hx of right sided lumbar microdiscectomy years ago.   Patient was then seen by neurosurgery on 5/11 for complaint of left lumbar radiculopathy without any cauda equina or weakness. She underwent lumbar MRI yesterday and concerns of L5-S1 septic arthropathy was noted. She was advised to present to ED for evaluation and neurosurgery has discussed with IR for plans of aspiration tomorrow.   She was afebrile, normotensive on room air. Mild leukocytosis of 13.4, platelet of 591. CMP unremarkable.   Review of Systems: Constitutional: No Weight Change, No Fever ENT/Mouth: No sore throat, No Rhinorrhea Eyes: No Eye Pain, No Vision Changes Cardiovascular: No Chest Pain, no SOB Respiratory: No Cough, No Sputum  Gastrointestinal: No Nausea, No Vomiting, No Diarrhea, No Constipation, No Pain Genitourinary: no Urinary Incontinence Musculoskeletal: No Arthralgias, + Myalgias Skin: No Skin Lesions, No Pruritus, Neuro: no Weakness, + Numbness Psych: No Anxiety/Panic, No Depression, no decrease appetite Heme/Lymph: No Bruising, No  Bleeding   Past Medical History:  Diagnosis Date  . ADHD   . Back pain   . Pneumonia   . Pulmonary embolism [facility])     Past Surgical History:  Procedure Laterality Date  . BACK SURGERY     2016  . TONSILLECTOMY       reports that she has quit smoking. She has never used smokeless tobacco. She reports current alcohol use. She reports that she does not use drugs.  Allergies  Allergen Reactions  . Blueberry Flavor Anaphylaxis  . Cat Hair Extract Dermatitis, Itching, Shortness Of Breath and Swelling  . Mold Extract [Trichophyton] Shortness Of Breath  . Fire [employer] and Swelling    Family History  Problem Relation Age of Onset  . Diabetes Father   . Breast cancer Maternal Grandmother   . Diabetes Maternal Grandmother   . Diabetes Paternal Grandmother   . Stroke Paternal Grandmother      Prior to Admission medications   Medication Sig Start Date End Date Taking? Authorizing Provider  aspirin 325 MG tablet Take 325 mg by mouth daily.   Yes [provider]  busPIRone (BUSPAR) 10 MG tablet Take 10 mg by mouth 2 (two) times daily.  05/26/16  Yes [provider]  dextroamphetamine (DEXTROSTAT) 10 MG tablet Take 10 mg by mouth 3 (three) times daily. 06/09/19  Yes [provider]  docusate sodium (COLACE) 100 MG capsule Take 1 capsule (100 mg total) by mouth every 12 (twelve) hours. 06/19/19  Yes Karen Kitchens, NP  fluticasone (FLONASE) 50 MCG/ACT nasal spray Place 1 spray into both nostrils daily.   Yes [provider]  gabapentin (NEURONTIN) 100 MG capsule  06/26/19  Yes [provider]  HYDROcodone-acetaminophen (NORCO) 5-325 MG tablet Take 1 tablet by mouth 3 (three) times daily as needed for moderate pain. 06/19/19  Yes Karen Kitchens, NP  lisdexamfetamine (VYVANSE) 70 MG capsule Take 70 mg by mouth daily.   Yes [provider]  montelukast (SINGULAIR) 10 MG tablet Take 10 mg by mouth daily. 08/06/16  Yes [provider]   oxyCODONE (OXY IR/ROXICODONE) 5 MG immediate release tablet Take 5 mg by mouth 2 (two) times daily as needed. 06/26/19  Yes [provider]    Physical Exam: Vitals:   07/04/19 1402 07/04/19 1730 07/04/19 1815  BP: 135/85 (!) 153/108 130/80  Pulse: (!) 102 88 92  Resp: 18 17   Temp: 98 F (36.7 C)    SpO2: 100% 97% 99%  Weight: 95.3 kg    Height: '5\' 3"'  (1.6 m)      Constitutional: NAD, calm, comfortable, nontoxic appearing obese female laying on her right side in bed Vitals:   07/04/19 1402 07/04/19 1730 07/04/19 1815  BP: 135/85 (!) 153/108 130/80  Pulse: (!) 102 88 92  Resp: 18 17   Temp: 98 F (36.7 C)    SpO2: 100% 97% 99%  Weight: 95.3 kg    Height: '5\' 3"'  (1.6 m)     Eyes: PERRL, lids and conjunctivae normal ENMT: Mucous membranes are moist. Neck: normal, supple Respiratory: clear to auscultation bilaterally, no wheezing, no crackles. Normal respiratory effort. No accessory muscle use.  Cardiovascular: Regular rate and rhythm, no murmurs / rubs / gallops. No extremity edema. 2+ pedal pulses. .  Abdomen: no tenderness, no masses palpated.  Bowel sounds positive.  Musculoskeletal: no clubbing / cyanosis. No joint deformity upper and lower extremities. Good ROM, no contractures. Normal muscle tone.  Skin: no rashes, lesions, ulcers. No induration Neurologic: CN 2-12 grossly intact. Sensation intact, DTR normal. Strength 5/5 in all 4.  Pain with focal palpation of the left paralumbar musculature without any overlying erythema or obvious deformities.  Intact sensation to groin region and bilateral lower extremity. Psychiatric: Normal judgment and insight. Alert and oriented x 3. Normal mood.    Labs on Admission: I have personally reviewed following labs and imaging studies  CBC: Recent Labs  Lab 07/04/19 1446  WBC 13.4*  NEUTROABS 8.8*  HGB 13.5  HCT 41.6  MCV 90.0  PLT 921*   Basic Metabolic Panel: Recent Labs  Lab 07/04/19 1446  NA 137  K 4.2   CL 103  CO2 25  GLUCOSE 90  BUN 12  CREATININE 0.63  CALCIUM 9.3   GFR: Estimated Creatinine Clearance: 98.6 mL/min (by C-G formula based on SCr of 0.63 mg/dL). Liver Function Tests: Recent Labs  Lab 07/04/19 1446  AST 15  ALT 32  ALKPHOS 73  BILITOT 0.5  PROT 7.5  ALBUMIN 3.4*   No results for input(s): LIPASE, AMYLASE in the last 168 hours. No results for input(s): AMMONIA in the last 168 hours. Coagulation Profile: No results for input(s): INR, PROTIME in the last 168 hours. Cardiac Enzymes: No results for input(s): CKTOTAL, CKMB, CKMBINDEX, TROPONINI in the last 168 hours. BNP (last 3 results) No results for input(s): PROBNP in the last 8760 hours. HbA1C: No results for input(s): HGBA1C in the last 72 hours. CBG: No results for input(s): GLUCAP in the last 168 hours. Lipid Profile: No results for input(s): CHOL, HDL, LDLCALC, TRIG, CHOLHDL, LDLDIRECT in the last 72 hours. Thyroid Function Tests: No results for input(s): TSH, T4TOTAL,  FREET4, T3FREE, THYROIDAB in the last 72 hours. Anemia Panel: No results for input(s): VITAMINB12, FOLATE, FERRITIN, TIBC, IRON, RETICCTPCT in the last 72 hours. Urine analysis:    Component Value Date/Time   COLORURINE YELLOW (A) 09/19/2017 1607   APPEARANCEUR CLOUDY (A) 09/19/2017 1607   LABSPEC 1.029 09/19/2017 1607   PHURINE 7.0 09/19/2017 1607   GLUCOSEU NEGATIVE 09/19/2017 1607   HGBUR NEGATIVE 09/19/2017 1607   BILIRUBINUR MODERATE (A) 09/19/2017 1607   KETONESUR NEGATIVE 09/19/2017 1607   PROTEINUR 30 (A) 09/19/2017 1607   NITRITE NEGATIVE 09/19/2017 1607   LEUKOCYTESUR MODERATE (A) 09/19/2017 1607    Radiological Exams on Admission: MR OUTSIDE FILMS SPINE  Result Date: 07/04/2019 This examination belongs to an outside facility and is stored here for comparison purposes only.  Contact the originating outside institution for any associated report or interpretation.     Assessment/Plan  Suspected L5-SI septic  arthropathy Blood cultures pending. ESR, CRP pending at [facility] Neurosurgery would like to avoid antibiotics until after aspiration unless she becomes septic  IR to aspirated in the morning. Keep NPO after midnight.  Need ID consult in the morning  PRN pain control Frequent neuro checks  Thrombocytosis  Reactive.  Continue to monitor.  History of anxiety Continue BuSpar  History of ADHD Hold stimulants for now while inpatient  DVT prophylaxis:SCDs Code Status: Full Family Communication: Plan discussed with patient and husband at bedside  disposition Plan: Home with at least 2 midnight stays  Consults called:  Admission status: inpatient     Status is: Inpatient  Remains inpatient appropriate because:Inpatient level of care appropriate due to severity of illness   Dispo: The patient is from: Home              Anticipated d/c is to: Home              Anticipated d/c date is: 3 days              Patient currently is not medically stable to d/c.          Orene Desanctis DO [facility]   If 7PM-7AM, please contact night-coverage [email or web address]   07/04/2019, 8:22 PM

## 2019-07-04 NOTE — ED Provider Notes (Signed)
[facility] Emergency Department Provider Note ____________________________________________   First MD Initiated Contact with Patient 07/04/19 1721     (approximate)  I have reviewed the triage vital signs and the nursing notes.   HISTORY  Chief Complaint Infection in spine    HPI Dana Lewis is a 44 y.o. female with PMH as noted below who presents for admission for an infection in her spine.  The patient reports pain to her central lower back over the last 2 to 3 days, gradual onset, worsening course, and severe in intensity at this time.  She denies any fever chills, or any numbness or weakness, but does report electrical sensations going up and down her spine.  She denies any trauma or injury.  The patient had an outpatient MRI which showed findings concerning for septic arthropathy at L5-S1.  She is planned for an IR biopsy procedure tomorrow.  Past Medical History:  Diagnosis Date  . ADHD   . Back pain   . Pneumonia   . Pulmonary embolism [facility])     Patient Active Problem List   Diagnosis Date Noted  . History of pulmonary embolism 04/14/2016  . Pulmonary embolism ([place]) 03/29/2016    Past Surgical History:  Procedure Laterality Date  . BACK SURGERY     2016  . TONSILLECTOMY      Prior to Admission medications   Medication Sig Start Date End Date Taking? Authorizing Provider  albuterol (PROVENTIL HFA;VENTOLIN HFA) 108 (90 Base) MCG/ACT inhaler Inhale 2 puffs into the lungs every 4 (four) hours as needed for wheezing or shortness of breath.    [provider]  aspirin 325 MG tablet Take 325 mg by mouth daily.    [provider]  busPIRone (BUSPAR) 10 MG tablet Take 10 mg by mouth as needed. 05/26/16   [provider]  docusate sodium (COLACE) 100 MG capsule Take 1 capsule (100 mg total) by mouth every 12 (twelve) hours. 06/19/19   Karen Kitchens, NP  fluticasone (FLONASE) 50 MCG/ACT nasal spray Place 1 spray into both  nostrils daily.    [provider]  HYDROcodone-acetaminophen (NORCO) 5-325 MG tablet Take 1 tablet by mouth 3 (three) times daily as needed for moderate pain. 06/19/19   Karen Kitchens, NP  lisdexamfetamine (VYVANSE) 70 MG capsule Take 70 mg by mouth daily.    [provider]  loratadine (CLARITIN) 10 MG tablet Take 10 mg by mouth daily.    [provider]  methylPREDNISolone (MEDROL DOSEPAK) 4 MG TBPK tablet Take by mouth daily - taper daily dose per package instructions. 06/19/19   Karen Kitchens, NP  montelukast (SINGULAIR) 10 MG tablet Take 10 mg by mouth daily. 08/06/16   [provider]  Multiple Vitamin (MULTIVITAMIN) tablet Take 1 tablet by mouth daily.    [provider]  vitamin C (ASCORBIC ACID) 500 MG tablet Take 500 mg by mouth daily.    [provider]    Allergies Blueberry flavor, Cat hair extract, and Fire ant  Family History  Problem Relation Age of Onset  . Diabetes Father   . Breast cancer Maternal Grandmother   . Diabetes Maternal Grandmother   . Diabetes Paternal Grandmother   . Stroke Paternal Grandmother     Social History Social History   Tobacco Use  . Smoking status: Former Research scientist (life sciences)  . Smokeless tobacco: Never Used  . Tobacco comment: quit 8 years ago  Substance Use Topics  . Alcohol use: Yes  Comment: occasional wine  . Drug use: No    Review of Systems  Constitutional: No fever/chills. Eyes: No redness. ENT: No neck pain. Cardiovascular: Denies chest pain. Respiratory: Denies shortness of breath. Gastrointestinal: No vomiting. Genitourinary: Negative for flank pain. Musculoskeletal: Positive for back pain. Skin: Negative for rash. Neurological: Negative for focal weakness or numbness.   ____________________________________________   PHYSICAL EXAM:  VITAL SIGNS: ED Triage Vitals [07/04/19 1402]  Enc Vitals Group     BP 135/85     Pulse Rate (!) 102     Resp 18     Temp 98 F (36.7  C)     Temp src      SpO2 100 %     Weight 210 lb (95.3 kg)     Height 5\' 3"  (1.6 m)     Head Circumference      Peak Flow      Pain Score 10     Pain Loc      Pain Edu?      Excl. in [place]?     Constitutional: Alert and oriented. Well appearing and in no acute distress. Eyes: Conjunctivae are normal.  Head: Atraumatic. Nose: No congestion/rhinnorhea. Mouth/Throat: Mucous membranes are moist.   Neck: Normal range of motion.  Cardiovascular: Normal rate, regular rhythm. Good peripheral circulation. Respiratory: Normal respiratory effort.  No retractions.  Gastrointestinal: No distention.  Genitourinary: No CVA tenderness. Musculoskeletal: No lower extremity edema.  Extremities warm and well perfused.  Neurologic: Motor intact in all extremities.  Normal coordination. Skin:  Skin is warm and dry. No rash noted. Psychiatric: Somewhat anxious appearing.  ____________________________________________   LABS (all labs ordered are listed, but only abnormal results are displayed)  Labs Reviewed  COMPREHENSIVE METABOLIC PANEL - Abnormal; Notable for the following components:      Result Value   Albumin 3.4 (*)    All other components within normal limits  CBC WITH DIFFERENTIAL/PLATELET - Abnormal; Notable for the following components:   WBC 13.4 (*)    Platelets 591 (*)    Neutro Abs 8.8 (*)    All other components within normal limits  SARS CORONAVIRUS 2 BY RT PCR (HOSPITAL ORDER, [place] [facility])  CULTURE, BLOOD (SINGLE)  LACTIC ACID, PLASMA  URINALYSIS, COMPLETE (UACMP) WITH MICROSCOPIC  POC URINE PREG, ED   ____________________________________________  EKG   ____________________________________________  RADIOLOGY    ____________________________________________   PROCEDURES  Procedure(s) performed: No  Procedures  Critical Care performed: No ____________________________________________   INITIAL IMPRESSION / ASSESSMENT AND PLAN /  ED COURSE  Pertinent labs & imaging results that were available during my care of the patient were reviewed by me and considered in my medical decision making (see chart for details).  44 year old female with PMH as noted above presents for admission for possible septic arthropathy in her lumbar spine diagnosed on an outpatient MRI.  The patient reports gradual onset worsening back pain over the last several days, but has had no fevers or systemic symptoms.  On exam she is uncomfortable appearing but in no acute distress.  Her vital signs are normal except for hypertension.  Neurologic exam is nonfocal.  I reviewed the past medical records in [place].  The patient also has a written report from the MRI with her.  It appears that the plan is IR biopsy tomorrow, and admission to the hospitalist.  Report have been given to Dr. Blaine Hamper this afternoon for a direct admission, however I  discussed with him that he is no longer the admitting doctor.  He states that the plan was to hold off on antibiotics until after the biopsy unless the patient demonstrates signs of sepsis.    I then consulted Dr. Flossie Buffy from the hospitalist service for admission.  ----------------------------------------- 6:59 PM on 07/04/2019 -----------------------------------------  I discussed the case with Dr. Cari Caraway from neurosurgery who is evaluated the patient in the ED.  He confirms the above plan. ____________________________________________   FINAL CLINICAL IMPRESSION(S) / ED DIAGNOSES  Final diagnoses:  Septic arthritis of vertebra, due to unspecified organism [facility])      NEW MEDICATIONS STARTED DURING THIS VISIT:  New Prescriptions   No medications on file     Note:  This document was prepared using Dragon voice recognition software and may include unintentional dictation errors.    Arta Silence, MD 07/04/19 1900

## 2019-07-05 ENCOUNTER — Encounter: Payer: Self-pay | Admitting: Family Medicine

## 2019-07-05 DIAGNOSIS — M4650 Other infective spondylopathies, site unspecified: Secondary | ICD-10-CM

## 2019-07-05 LAB — PREGNANCY, URINE: Preg Test, Ur: NEGATIVE

## 2019-07-05 LAB — URINALYSIS, COMPLETE (UACMP) WITH MICROSCOPIC
Bilirubin Urine: NEGATIVE
Glucose, UA: NEGATIVE mg/dL
Hgb urine dipstick: NEGATIVE
Ketones, ur: NEGATIVE mg/dL
Leukocytes,Ua: NEGATIVE
Nitrite: NEGATIVE
Protein, ur: NEGATIVE mg/dL
Specific Gravity, Urine: 1.028 (ref 1.005–1.030)
pH: 6 (ref 5.0–8.0)

## 2019-07-05 LAB — CBC
HCT: 38.1 % (ref 36.0–46.0)
Hemoglobin: 12.5 g/dL (ref 12.0–15.0)
MCH: 29 pg (ref 26.0–34.0)
MCHC: 32.8 g/dL (ref 30.0–36.0)
MCV: 88.4 fL (ref 80.0–100.0)
Platelets: 543 10*3/uL — ABNORMAL HIGH (ref 150–400)
RBC: 4.31 MIL/uL (ref 3.87–5.11)
RDW: 12.2 % (ref 11.5–15.5)
WBC: 12.4 10*3/uL — ABNORMAL HIGH (ref 4.0–10.5)
nRBC: 0 % (ref 0.0–0.2)

## 2019-07-05 LAB — BASIC METABOLIC PANEL
Anion gap: 8 (ref 5–15)
BUN: 11 mg/dL (ref 6–20)
CO2: 29 mmol/L (ref 22–32)
Calcium: 8.7 mg/dL — ABNORMAL LOW (ref 8.9–10.3)
Chloride: 102 mmol/L (ref 98–111)
Creatinine, Ser: 0.76 mg/dL (ref 0.44–1.00)
GFR calc Af Amer: >60 mL/min (ref >60)
GFR calc non Af Amer: >60 mL/min (ref >60)
Glucose, Bld: 84 mg/dL (ref 70–99)
Potassium: 4.2 mmol/L (ref 3.5–5.1)
Sodium: 139 mmol/L (ref 135–145)

## 2019-07-05 LAB — HIV ANTIBODY (ROUTINE TESTING W REFLEX): HIV Screen 4th Generation wRfx: NONREACTIVE

## 2019-07-05 NOTE — Consult Note (Signed)
NAME: Dana Lewis  DOB: June 14, 1975  MRN: [id number]  Date/Time: 07/05/2019 4:58 PM  REQUESTING PROVIDER: Mal Misty Subjective:  REASON FOR CONSULT: L5-s1septic arthropathy ? Dana Lewis is a 44 y.o. female is admitted to the hospital because of low back pain and abnormal MRI  .  On 06/19/2019 patient went to urgent care complaining of lower back pain that was bothering her since 06/16/2019. On June 15 2018 when she was moving heavy furniture and felt a sharp pain in the back secondary to what she thought was a slipped disc.  The pain was radiating to the posterior aspect of the left lower extremity.  She was referred to neurosurgery.  She has a previous history of microdiscectomy in 09/2014 performed in [place].  Patient has had intermittent flareup of low back pain following a fall she had in 2019 which noted multilevel degenerative changes at L4 S1.  She was given a steroid taper and was referred to neurosurgery.  She saw Dr. Lacinda Axon the [place] [facility] on 06/26/2019 She had an MRI at an outside facility on 07/03/2019 and that showed L5-S1 septic arthropathy.  She was referred to the hospital to get aspiration by IR for microbiological diagnosis.  I am asked to see the patient for the same. She has no fever, no chills Past Medical History:  Diagnosis Date  . ADHD   . Back pain   . Pneumonia   . Pulmonary embolism [facility])     Past Surgical History:  Procedure Laterality Date  . BACK SURGERY     2016  . TONSILLECTOMY      Social History   Socioeconomic History  . Marital status: Married    Spouse name: Not on file  . Number of children: Not on file  . Years of education: Not on file  . Highest education level: Not on file  Occupational History  . Not on file  Tobacco Use  . Smoking status: Former Research scientist (life sciences)  . Smokeless tobacco: Never Used  . Tobacco comment: quit 8 years ago  Substance and Sexual Activity  . Alcohol use: Yes    Comment: occasional wine  . Drug use: No  . Sexual activity:  Yes    Birth control/protection: Surgical, I.U.D.  Other Topics Concern  . Not on file  Social History Narrative   Lives at home with husband   Social Determinants of Health   Financial Resource Strain:   . Difficulty of Paying Living Expenses:   Food Insecurity:   . Worried About Charity fundraiser in the Last Year:   . Arboriculturist in the Last Year:   Transportation Needs:   . Film/video editor (Medical):   Marland Kitchen Lack of Transportation (Non-Medical):   Physical Activity:   . Days of Exercise per Week:   . Minutes of Exercise per Session:   Stress:   . Feeling of Stress :   Social Connections:   . Frequency of Communication with Friends and Family:   . Frequency of Social Gatherings with Friends and Family:   . Attends Religious Services:   . Active Member of Clubs or Organizations:   . Attends Archivist Meetings:   Marland Kitchen Marital Status:   Intimate Partner Violence:   . Fear of Current or Ex-Partner:   . Emotionally Abused:   Marland Kitchen Physically Abused:   . Sexually Abused:     Family History  Problem Relation Age of Onset  . Diabetes Father   . Breast cancer Maternal  Grandmother   . Diabetes Maternal Grandmother   . Diabetes Paternal Grandmother   . Stroke Paternal Grandmother    Allergies  Allergen Reactions  . Blueberry Flavor Anaphylaxis  . Cat Hair Extract Dermatitis, Itching, Shortness Of Breath and Swelling  . Mold Extract [Trichophyton] Shortness Of Breath  . [employer] and Swelling  ? Current Facility-Administered Medications  Medication Dose Route Frequency Provider Last Rate Last Admin  . aspirin EC tablet 325 mg  325 mg Oral Daily Tu, Ching T, DO   Stopped at 07/05/19 [id number]  . busPIRone (BUSPAR) tablet 10 mg  10 mg Oral BID Tu, Ching T, DO   Stopped at 07/05/19 [id number]  . docusate sodium (COLACE) capsule 100 mg  100 mg Oral Q12H Tu, Ching T, DO   Stopped at 07/05/19 [id number]  . fluticasone (FLONASE) 50 MCG/ACT nasal spray 1 spray  1 spray Each Nare  Daily PRN Tu, Ching T, DO      . montelukast (SINGULAIR) tablet 10 mg  10 mg Oral Daily Tu, Ching T, DO   Stopped at 07/05/19 [id number]  . morphine 2 MG/ML injection 2 mg  2 mg Intravenous Q3H PRN Tu, Ching T, DO   2 mg at 07/05/19 1354  . oxyCODONE-acetaminophen (PERCOCET) 7.5-325 MG per tablet 1 tablet  1 tablet Oral Q4H PRN Tu, Ching T, DO   1 tablet at 07/05/19 1458     Abtx:  Anti-infectives (From admission, onward)   None      REVIEW OF SYSTEMS:  Const: negative fever, negative chills, negative weight loss Eyes: negative diplopia or visual changes, negative eye pain ENT: negative coryza, negative sore throat Resp: negative cough, hemoptysis, dyspnea Cards: negative for chest pain, palpitations, lower extremity edema GU: negative for frequency, dysuria and hematuria GI: Negative for abdominal pain, diarrhea, bleeding, constipation Skin: negative for rash and pruritus Heme: negative for easy bruising and gum/nose bleeding MS: negative for myalgias, arthralgias, back pain and muscle weakness Neurolo:negative for headaches, dizziness, vertigo, memory problems  Psych: negative for feelings of anxiety, depression  Endocrine: negative for thyroid, diabetes Allergy/Immunology- negative for any medication but has environmental allergies ? Objective:  VITALS:  BP 116/71 (BP Location: Left Arm)   Pulse 96   Temp 97.8 F (36.6 C)   Resp 17   Ht 5\' 3"  (1.6 m)   Wt 95.3 kg   LMP 05/31/2019   SpO2 99%   BMI 37.20 kg/m  PHYSICAL EXAM:  General: Alert, cooperative, in distress, appears stated age.  Head: Normocephalic, without obvious abnormality, atraumatic. Eyes: Conjunctivae clear, anicteric sclerae. Pupils are equal ENT Nares normal. No drainage or sinus tenderness. Lips, mucosa, and tongue normal. No Thrush Neck: Supple, symmetrical, no adenopathy, thyroid: non tender no carotid bruit and no JVD. Back: left lower back warmth and tenderness Lungs: Clear to auscultation  bilaterally. No Wheezing or Rhonchi. No rales. Heart: Regular rate and rhythm, no murmur, rub or gallop. Abdomen: Soft, non-tender,not distended. Bowel sounds normal. No masses Extremities: atraumatic, no cyanosis. No edema. No clubbing Skin: No rashes or lesions. Or bruising Lymph: Cervical, supraclavicular normal. Neurologic: Grossly non-focal Pertinent Labs Lab Results CBC    Component Value Date/Time   WBC 12.4 (H) 07/05/2019 0443   RBC 4.31 07/05/2019 0443   HGB 12.5 07/05/2019 0443   HCT 38.1 07/05/2019 0443   PLT 543 (H) 07/05/2019 0443   MCV 88.4 07/05/2019 0443   MCH 29.0 07/05/2019 0443   MCHC 32.8 07/05/2019 0443   RDW 12.2  07/05/2019 0443   LYMPHSABS 3.4 07/04/2019 1446   MONOABS 0.7 07/04/2019 1446   EOSABS 0.4 07/04/2019 1446   BASOSABS 0.1 07/04/2019 1446    CMP Latest Ref Rng & Units 07/05/2019 07/04/2019 09/19/2017  Glucose 70 - 99 mg/dL 84 90 95  BUN 6 - 20 mg/dL 11 12 13   Creatinine 0.44 - 1.00 mg/dL 0.76 0.63 1.06(H)  Sodium 135 - 145 mmol/L 139 137 141  Potassium 3.5 - 5.1 mmol/L 4.2 4.2 4.0  Chloride 98 - 111 mmol/L 102 103 109  CO2 22 - 32 mmol/L 29 25 24   Calcium 8.9 - 10.3 mg/dL 8.7(L) 9.3 8.7(L)  Total Protein 6.5 - 8.1 g/dL - 7.5 6.3(L)  Total Bilirubin 0.3 - 1.2 mg/dL - 0.5 0.5  Alkaline Phos 38 - 126 U/L - 73 55  AST 15 - 41 U/L - 15 21  ALT 0 - 44 U/L - 32 22      Microbiology: Recent Results (from the past 240 hour(s))  Blood culture (single)     Status: None (Preliminary result)   Collection Time: 07/04/19  2:43 PM   Specimen: BLOOD  Result Value Ref Range Status   Specimen Description BLOOD LEFT ANTECUBITAL  Final   Special Requests   Final    BOTTLES DRAWN AEROBIC AND ANAEROBIC Blood Culture adequate volume   Culture   Final    NO GROWTH < 12 HOURS Performed at [facility], [address], [place], [place] [address]    Report Status PENDING  Incomplete  SARS Coronavirus 2 by RT PCR (hospital order, performed in [place] hospital lab) Nasopharyngeal Nasopharyngeal Swab     Status: None   Collection Time: 07/04/19  5:41 PM   Specimen: Nasopharyngeal Swab  Result Value Ref Range Status   SARS Coronavirus 2 NEGATIVE NEGATIVE Final    Comment: (NOTE) SARS-CoV-2 target nucleic acids are NOT DETECTED. The SARS-CoV-2 RNA is generally detectable in upper and lower respiratory specimens during the acute phase of infection. The lowest concentration of SARS-CoV-2 viral copies this assay can detect is 250 copies / mL. A negative result does not preclude SARS-CoV-2 infection and should not be used as the sole basis for treatment or other patient management decisions.  A negative result may occur with improper specimen collection / handling, submission of specimen other than nasopharyngeal swab, presence of viral mutation(s) within the areas targeted by this assay, and inadequate number of viral copies (<250 copies / mL). A negative result must be combined with clinical observations, patient history, and epidemiological information. Fact Sheet for Patients:   StrictlyIdeas.no Fact Sheet for Healthcare Providers: BankingDealers.co.za This test is not yet approved or cleared  by the [place] FDA and has been authorized for detection and/or diagnosis of SARS-CoV-2 by FDA under an Emergency Use Authorization (EUA).  This EUA will remain in effect (meaning this test can be used) for the duration of the COVID-19 declaration under Section 564(b)(1) of the Act, 21 U.S.C. section 360bbb-3(b)(1), unless the authorization is terminated or revoked sooner. Performed at [facility], [address] [place], [place] [address]     IMAGING RESULTS: None to review in this hospital.  I have personally reviewed the films ? Impression/Recommendation ? ?Patient presenting with acute onset of back pain radiating to the leg on the left side.  Found to have L5-S1  septic arthropathy changes as per the outside MRI.   Patient is awaiting IR to aspiration and send it for culture before  antibiotics could be started. Would wait for cultures to finalize before stating antibiotics as if the first IR procedure fail to yield a bacteria then she will have to undergo surgical intervention for diagnosis.  Discussed the management with patient and her husband ? Note:  This document was prepared using Dragon voice recognition software and may include unintentional dictation errors.

## 2019-07-05 NOTE — Progress Notes (Addendum)
Progress Note    [name]  [id number] DOB: Dec 22, 1975  DOA: 07/04/2019 PCP: Barbaraann Boys, MD      Brief Narrative:    Medical records reviewed and are as summarized below:  Dana Lewis is an 44 y.o. female  with medical history significant for Hx of PE, ADHD, anxiety, and hx of microdiscectomy who presented at the behest of the neurosurgical team because of findings concerning for L5-S1 septic arthropathy on MRI lumbar spine.  She complained of left-sided lower back pain.      Assessment/Plan:   Principal Problem:   Spinal abscess (HCC) Active Problems:   Thrombocytosis (HCC)   Anxiety   ADHD   Probable L5-S1 septic arthropathy on MRI lumbar spine, left-sided lower back pain, mild leukocytosis: Analgesics as needed for pain.  Follow-up with interventional radiologist for joint aspiration.  Consulted ID specialist to assist with management.  No antibiotics for now pending joint aspiration for culture and sensitivity.  Case was discussed with interventional radiologist, Dr. Kathlene Cote.  He said he is unable to perform procedure because she is too close to the spine.  Dr. Jarvis Newcomer, interventional radiologist will be here tomorrow to perform the procedure.  Patient has been updated about the plan of care.  Thrombocytosis: Monitor CBC.  Anxiety and ADHD: No acute issues.  History of PE.  Body mass index is 37.2 kg/m.  (Morbid obesity)   Family Communication/Anticipated D/C date and plan/Code Status   DVT prophylaxis: SCDs Code Status: Full code Family Communication: Plan discussed with her husband at the bedside Disposition Plan:    Status is: Inpatient  Remains inpatient appropriate because:Unsafe d/c plan and Inpatient level of care appropriate due to severity of illness   Dispo: The patient is from: Home              Anticipated d/c is to: Home              Anticipated d/c date is: > 3 days              Patient currently is not medically stable to  d/c.            Subjective:   She complains of left-sided low back pain location of pain in the left leg (this appears to coincide with pain in the left side of her back).  No numbness, tingling or weakness in the lower extremities.  No fever, chills, constipation, urinary retention, fecal or urinary incontinence.   Objective:    Vitals:   07/05/19 0005 07/05/19 0428 07/05/19 0735 07/05/19 1135  BP:  95/69 115/77 119/79  Pulse: 88 89 79 79  Resp:  18  16  Temp:  97.9 F (36.6 C) 98.3 F (36.8 C) 97.8 F (36.6 C)  TempSrc:   Oral Oral  SpO2:  100% 98% 97%  Weight:      Height:       No data found.  No intake or output data in the 24 hours ending 07/05/19 1216 Filed Weights   07/04/19 1402  Weight: 95.3 kg    Exam:  GEN: NAD SKIN: No rash EYES: EOMI ENT: MMM CV: RRR PULM: CTA B ABD: soft, obese, NT, +BS CNS: AAO x 3, non focal EXT: No edema or tenderness MSK: Left lumbar paraspinal tenderness   Data Reviewed:   I have personally reviewed following labs and imaging studies:  Labs: Labs show the following:   Basic Metabolic Panel: Recent Labs  Lab 07/04/19 1446 07/05/19  0443  NA 137 139  K 4.2 4.2  CL 103 102  CO2 25 29  GLUCOSE 90 84  BUN 12 11  CREATININE 0.63 0.76  CALCIUM 9.3 8.7*   GFR Estimated Creatinine Clearance: 98.6 mL/min (by C-G formula based on SCr of 0.76 mg/dL). Liver Function Tests: Recent Labs  Lab 07/04/19 1446  AST 15  ALT 32  ALKPHOS 73  BILITOT 0.5  PROT 7.5  ALBUMIN 3.4*   No results for input(s): LIPASE, AMYLASE in the last 168 hours. No results for input(s): AMMONIA in the last 168 hours. Coagulation profile No results for input(s): INR, PROTIME in the last 168 hours.  CBC: Recent Labs  Lab 07/04/19 1446 07/05/19 0443  WBC 13.4* 12.4*  NEUTROABS 8.8*  --   HGB 13.5 12.5  HCT 41.6 38.1  MCV 90.0 88.4  PLT 591* 543*   Cardiac Enzymes: No results for input(s): CKTOTAL, CKMB, CKMBINDEX,  TROPONINI in the last 168 hours. BNP (last 3 results) No results for input(s): PROBNP in the last 8760 hours. CBG: No results for input(s): GLUCAP in the last 168 hours. D-Dimer: No results for input(s): DDIMER in the last 72 hours. Hgb A1c: No results for input(s): HGBA1C in the last 72 hours. Lipid Profile: No results for input(s): CHOL, HDL, LDLCALC, TRIG, CHOLHDL, LDLDIRECT in the last 72 hours. Thyroid function studies: No results for input(s): TSH, T4TOTAL, T3FREE, THYROIDAB in the last 72 hours.  Invalid input(s): FREET3 Anemia work up: No results for input(s): VITAMINB12, FOLATE, FERRITIN, TIBC, IRON, RETICCTPCT in the last 72 hours. Sepsis Labs: Recent Labs  Lab 07/04/19 1446 07/05/19 0443  WBC 13.4* 12.4*  LATICACIDVEN 1.4  --     Microbiology Recent Results (from the past 240 hour(s))  Blood culture (single)     Status: None (Preliminary result)   Collection Time: 07/04/19  2:43 PM   Specimen: BLOOD  Result Value Ref Range Status   Specimen Description BLOOD LEFT ANTECUBITAL  Final   Special Requests   Final    BOTTLES DRAWN AEROBIC AND ANAEROBIC Blood Culture adequate volume   Culture   Final    NO GROWTH < 12 HOURS Performed at [facility], [address], [place], [place] [address]    Report Status PENDING  Incomplete  SARS Coronavirus 2 by RT PCR (hospital order, performed in [place] hospital lab) Nasopharyngeal Nasopharyngeal Swab     Status: None   Collection Time: 07/04/19  5:41 PM   Specimen: Nasopharyngeal Swab  Result Value Ref Range Status   SARS Coronavirus 2 NEGATIVE NEGATIVE Final    Comment: (NOTE) SARS-CoV-2 target nucleic acids are NOT DETECTED. The SARS-CoV-2 RNA is generally detectable in upper and lower respiratory specimens during the acute phase of infection. The lowest concentration of SARS-CoV-2 viral copies this assay can detect is 250 copies / mL. A negative result does not preclude SARS-CoV-2 infection and should  not be used as the sole basis for treatment or other patient management decisions.  A negative result may occur with improper specimen collection / handling, submission of specimen other than nasopharyngeal swab, presence of viral mutation(s) within the areas targeted by this assay, and inadequate number of viral copies (<250 copies / mL). A negative result must be combined with clinical observations, patient history, and epidemiological information. Fact Sheet for Patients:   StrictlyIdeas.no Fact Sheet for Healthcare Providers: BankingDealers.co.za This test is not yet approved or cleared  by the [place] FDA and has been authorized  for detection and/or diagnosis of SARS-CoV-2 by FDA under an Emergency Use Authorization (EUA).  This EUA will remain in effect (meaning this test can be used) for the duration of the COVID-19 declaration under Section 564(b)(1) of the Act, 21 U.S.C. section 360bbb-3(b)(1), unless the authorization is terminated or revoked sooner. Performed at [facility], [address] [place], [place] [address]     Procedures and diagnostic studies:  MR OUTSIDE FILMS SPINE  Result Date: 07/04/2019 This examination belongs to an outside facility and is stored here for comparison purposes only.  Contact the originating outside institution for any associated report or interpretation.   Medications:   . aspirin EC  325 mg Oral Daily  . busPIRone  10 mg Oral BID  . docusate sodium  100 mg Oral Q12H  . montelukast  10 mg Oral Daily   Continuous Infusions:   LOS: 1 day   Markale Birdsell  [facility]     07/05/2019, 12:16 PM

## 2019-07-05 NOTE — Progress Notes (Signed)
   07/04/19 2159  Assess: MEWS Score  BP 140/90  Pulse Rate (!) 112  Resp 18  SpO2 100 %  O2 Device Room Air  Assess: MEWS Score  MEWS Temp 0  MEWS Systolic 0  MEWS Pulse 2  MEWS RR 0  MEWS LOC 0  MEWS Score 2  MEWS Score Color Yellow  Assess: if the MEWS score is Yellow or Red  Were vital signs taken at a resting state? Yes  Focused Assessment Documented focused assessment  Early Detection of Sepsis Score *See Row Information* Low  MEWS guidelines implemented *See Row Information* Yes  Treat  MEWS Interventions Administered prn meds/treatments  Take Vital Signs  Increase Vital Sign Frequency  Yellow: Q 2hr X 2 then Q 4hr X 2, if remains yellow, continue Q 4hrs  Escalate  MEWS: Escalate Yellow: discuss with charge nurse/RN and consider discussing with provider and RRT  Notify: Charge Nurse/RN  Name of Charge Nurse/RN Notified Altha Harm, RN  Date Charge Nurse/RN Notified 07/04/19  Time Charge Nurse/RN Notified 2230  Document  Patient Outcome Stabilized after interventions

## 2019-07-06 ENCOUNTER — Encounter: Payer: Self-pay | Admitting: Family Medicine

## 2019-07-06 ENCOUNTER — Other Ambulatory Visit: Payer: Self-pay

## 2019-07-06 ENCOUNTER — Inpatient Hospital Stay: Payer: BC Managed Care – PPO

## 2019-07-06 HISTORY — PX: IR FLUORO GUIDED NEEDLE PLC ASPIRATION/INJECTION LOC: IMG2395

## 2019-07-06 LAB — C-REACTIVE PROTEIN: CRP: 4.1 mg/dL — ABNORMAL HIGH (ref <1.0)

## 2019-07-06 LAB — SEDIMENTATION RATE: Sed Rate: 72 mm/hr — ABNORMAL HIGH (ref 0–20)

## 2019-07-06 MED ORDER — MORPHINE SULFATE (PF) 2 MG/ML IV SOLN
2.0000 mg | INTRAVENOUS | Status: DC | PRN
  Administered 2019-07-06 – 2019-07-08 (×11): 2 mg via INTRAVENOUS
  Filled 2019-07-06 (×12): qty 1

## 2019-07-06 MED ORDER — FENTANYL CITRATE (PF) 100 MCG/2ML IJ SOLN
INTRAMUSCULAR | Status: AC
  Filled 2019-07-06: qty 2

## 2019-07-06 MED ORDER — FENTANYL CITRATE (PF) 100 MCG/2ML IJ SOLN
INTRAMUSCULAR | Status: AC | PRN
  Administered 2019-07-06 (×2): 50 ug via INTRAVENOUS

## 2019-07-06 MED ORDER — MIDAZOLAM HCL 2 MG/2ML IJ SOLN
INTRAMUSCULAR | Status: AC | PRN
  Administered 2019-07-06 (×2): 1 mg via INTRAVENOUS

## 2019-07-06 MED ORDER — SODIUM CHLORIDE 0.9 % IV SOLN: Freq: Once | INTRAVENOUS | Status: AC

## 2019-07-06 MED ORDER — MIDAZOLAM HCL 2 MG/2ML IJ SOLN
INTRAMUSCULAR | Status: AC
  Filled 2019-07-06: qty 2

## 2019-07-06 NOTE — Progress Notes (Signed)
Pt with pain unrelieved by PRN pain medications. VSS, neuro checks WDL; Pt pain in lower left back, radiating at times to left leg, denies numbness/tingling. Sharion Settler, NP made aware; pain medication adjusted, adminstered as ordered

## 2019-07-06 NOTE — Consult Note (Signed)
Chief Complaint: Patient was seen in consultation today for L5-S1 facet aspiration.  Referring Physician(s): Jennye Boroughs  Supervising Physician: Arne Cleveland  Patient Status: [facility] - In-pt  History of Present Illness: Dana Lewis is a 44 y.o. female with a past medical history significant for ADHD, anxiety, PE and chronic back pain with history of lumbar microdiscectomy (2016) who presented to the ED on 07/04/19 with complaints of severe back pain. She began to experience left sided lumbar pain earlier this month after she lifted something heavy, she felt a pop and a shooting pain down her leg. She was using Vicoden and Ibuprofen at home for pain relief however the pain was poorly controlled so went to the neurosurgery clinic on 5/11. She underwent a lumbar MRI on 5/19 and findings were concerning for L5-S1 septic arthritis, she was contact by neurosurgery and instructed to present to the ED for further evaluation. IR has been asked to perform an image guided L5-S1 facet aspiration to further guide care.   Dana Lewis seen in IR holding area, she is laying on her left side and complaining of significant back pain as well as burning at her right forearm IV site. She denies any other complaints and is ready to proceed as planned.  Past Medical History:  Diagnosis Date  . ADHD   . Back pain   . Pneumonia   . Pulmonary embolism [facility])     Past Surgical History:  Procedure Laterality Date  . BACK SURGERY     2016  . TONSILLECTOMY      Allergies: Blueberry flavor, Cat hair extract, Mold extract [trichophyton], and Fire ant  Medications: Prior to Admission medications   Medication Sig Start Date End Date Taking? Authorizing Provider  aspirin 325 MG tablet Take 325 mg by mouth daily.   Yes [provider]  busPIRone (BUSPAR) 10 MG tablet Take 10 mg by mouth 2 (two) times daily.  05/26/16  Yes [provider]  dextroamphetamine (DEXTROSTAT) 10 MG tablet Take 10 mg  by mouth 3 (three) times daily. 06/09/19  Yes [provider]  docusate sodium (COLACE) 100 MG capsule Take 1 capsule (100 mg total) by mouth every 12 (twelve) hours. 06/19/19  Yes Karen Kitchens, NP  fluticasone (FLONASE) 50 MCG/ACT nasal spray Place 1 spray into both nostrils daily.   Yes [provider]  gabapentin (NEURONTIN) 100 MG capsule  06/26/19  Yes [provider]  HYDROcodone-acetaminophen (NORCO) 5-325 MG tablet Take 1 tablet by mouth 3 (three) times daily as needed for moderate pain. 06/19/19  Yes Karen Kitchens, NP  lisdexamfetamine (VYVANSE) 70 MG capsule Take 70 mg by mouth daily.   Yes [provider]  montelukast (SINGULAIR) 10 MG tablet Take 10 mg by mouth daily. 08/06/16  Yes [provider]  oxyCODONE (OXY IR/ROXICODONE) 5 MG immediate release tablet Take 5 mg by mouth 2 (two) times daily as needed. 06/26/19  Yes [provider]     Family History  Problem Relation Age of Onset  . Diabetes Father   . Breast cancer Maternal Grandmother   . Diabetes Maternal Grandmother   . Diabetes Paternal Grandmother   . Stroke Paternal Grandmother     Social History   Socioeconomic History  . Marital status: Married    Spouse name: Not on file  . Number of children: Not on file  . Years of education: Not on file  . Highest education level: Not on file  Occupational History  .  Not on file  Tobacco Use  . Smoking status: Former Research scientist (life sciences)  . Smokeless tobacco: Never Used  . Tobacco comment: quit 8 years ago  Substance and Sexual Activity  . Alcohol use: Yes    Comment: occasional wine  . Drug use: No  . Sexual activity: Yes    Birth control/protection: Surgical, I.U.D.  Other Topics Concern  . Not on file  Social History Narrative   Lives at home with husband   Social Determinants of Health   Financial Resource Strain:   . Difficulty of Paying Living Expenses:   Food Insecurity:   . Worried About Charity fundraiser in the  Last Year:   . Arboriculturist in the Last Year:   Transportation Needs:   . Film/video editor (Medical):   Marland Kitchen Lack of Transportation (Non-Medical):   Physical Activity:   . Days of Exercise per Week:   . Minutes of Exercise per Session:   Stress:   . Feeling of Stress :   Social Connections:   . Frequency of Communication with Friends and Family:   . Frequency of Social Gatherings with Friends and Family:   . Attends Religious Services:   . Active Member of Clubs or Organizations:   . Attends Archivist Meetings:   Marland Kitchen Marital Status:      Review of Systems: A 12 point ROS discussed and pertinent positives are indicated in the HPI above.  All other systems are negative.  Review of Systems  Constitutional: Negative for chills and fever.  Respiratory: Negative for cough and shortness of breath.   Cardiovascular: Negative for chest pain.  Gastrointestinal: Negative for abdominal pain, blood in stool, diarrhea, nausea and vomiting.  Genitourinary: Negative for hematuria.  Musculoskeletal: Positive for back pain.  Skin: Negative for rash and wound.  Neurological: Negative for dizziness, numbness and headaches.    Vital Signs: BP (!) 104/46 (BP Location: Right Arm)   Pulse 83   Temp 98.5 F (36.9 C) (Oral)   Resp 16   Ht 5\' 3"  (1.6 m)   Wt 210 lb 1.6 oz (95.3 kg)   LMP 05/31/2019   SpO2 98%   BMI 37.22 kg/m   Physical Exam Vitals and nursing note reviewed.  Constitutional:      Comments: Patient laying on right side in bed, c/o low back pain with any movements, she is able to provide history but often has to pause due to pain.  HENT:     Head: Normocephalic and atraumatic.     Mouth/Throat:     Mouth: Mucous membranes are moist.     Pharynx: Oropharynx is clear. No oropharyngeal exudate or posterior oropharyngeal erythema.  Cardiovascular:     Rate and Rhythm: Normal rate and regular rhythm.  Pulmonary:     Effort: Pulmonary effort is normal.      Breath sounds: Normal breath sounds.  Abdominal:     General: There is no distension.     Palpations: Abdomen is soft.     Tenderness: There is no abdominal tenderness.  Skin:    General: Skin is warm and dry.  Neurological:     Mental Status: She is alert and oriented to person, place, and time.  Psychiatric:        Mood and Affect: Mood normal.        Behavior: Behavior normal.        Thought Content: Thought content normal.        Judgment:  Judgment normal.      MD Evaluation Airway: WNL Heart: WNL Abdomen: WNL Chest/ Lungs: WNL ASA  Classification: 3 Mallampati/Airway Score: Two   Imaging: DG Lumbar Spine Complete  Result Date: 06/19/2019 CLINICAL DATA:  Pain after moving furniture. EXAM: LUMBAR SPINE - COMPLETE 4+ VIEW COMPARISON:  09/19/2017. FINDINGS: Lumbar spine numbered the lowest segmented appearing lumbar shaped vertebrae on lateral view as L5. Paraspinal soft tissues are unremarkable. L5-S1 disc degeneration. No acute bony abnormality identified. No evidence of fracture. Large amount of stool noted throughout the colon. Pelvic calcifications consistent phleboliths. IMPRESSION: L5-S1 disc degeneration.  No acute abnormality identified. Electronically Signed   By: Marcello Moores  Register   On: 06/19/2019 14:30   MR OUTSIDE FILMS SPINE  Result Date: 07/04/2019 This examination belongs to an outside facility and is stored here for comparison purposes only.  Contact the originating outside institution for any associated report or interpretation.   Labs:  CBC: Recent Labs    07/04/19 1446 07/05/19 0443  WBC 13.4* 12.4*  HGB 13.5 12.5  HCT 41.6 38.1  PLT 591* 543*    COAGS: No results for input(s): INR, APTT in the last 8760 hours.  BMP: Recent Labs    07/04/19 1446 07/05/19 0443  NA 137 139  K 4.2 4.2  CL 103 102  CO2 25 29  GLUCOSE 90 84  BUN 12 11  CALCIUM 9.3 8.7*  CREATININE 0.63 0.76  GFRNONAA >60 >60  GFRAA >60 >60    LIVER FUNCTION  TESTS: Recent Labs    07/04/19 1446  BILITOT 0.5  AST 15  ALT 32  ALKPHOS 73  PROT 7.5  ALBUMIN 3.4*    TUMOR MARKERS: No results for input(s): AFPTM, CEA, CA199, CHROMGRNA in the last 8760 hours.  Assessment and Plan:  44 y/o F with chronic back pain and previous history of microdiscectomy (2016) who presented to the neurosurgery clinic on 5/11 with complaints of worsening low back pain after lifting a heavy object , MRI was performed on 5/19 and showed concerns for L5-S1 septic arthritis. She presented to the ED for further evaluation and management. IR has been asked to perform an L5-S1 facet aspiration to further guide care.  Patient has been NPO since midnight, no current anticoagulation/antiplatelet therapy.  Risks and benefits discussed with the patient including, but not limited to bleeding, infection, damage to adjacent structures or low yield requiring additional tests.  All of the patient's questions were answered, patient is agreeable to proceed.  Consent signed and in chart.  Thank you for this interesting consult.  I greatly enjoyed meeting Dana Lewis and look forward to participating in their care.  A copy of this report was sent to the requesting provider on this date.  Electronically Signed: Joaquim Nam, PA-C 07/06/2019, 8:10 AM   I spent a total of 40 Minutes in face to face in clinical consultation, greater than 50% of which was counseling/coordinating care for L5-S1 facet aspiration.

## 2019-07-06 NOTE — Procedures (Signed)
  Procedure: Aspiration L5-S1 facet left under fluoro 1.72ml blood-tinged purulent, for GS, C&S EBL:   minimal Complications:  none immediate  See full dictation in [facility].  Dillard Cannon MD Main # [phone] Pager  [phone]

## 2019-07-06 NOTE — Progress Notes (Signed)
ID Pt had aspiration of the L5-S1 area by IR Fluid sent for culture Patient Vitals for the past 24 hrs:  BP Temp Temp src Pulse Resp SpO2 Height Weight  07/06/19 1720 117/72 98.9 F (37.2 C) Oral 94 16 100 % -- --  07/06/19 0935 101/61 -- -- 84 12 96 % -- --  07/06/19 0930 (!) 89/54 -- -- 78 12 95 % -- --  07/06/19 0915 (!) 101/48 -- -- 80 12 94 % -- --  07/06/19 0900 (!) 98/53 -- -- -- 12 95 % -- --  07/06/19 0840 119/66 -- -- 82 12 100 % -- --  07/06/19 0805 115/67 98.1 F (36.7 C) Oral 90 12 98 % 5\' 3"  (1.6 m) 95.3 kg  07/06/19 0749 (!) 104/46 98.5 F (36.9 C) Oral 83 16 98 % -- --  07/05/19 2355 116/80 98.4 F (36.9 C) Oral 83 18 100 % -- --   Will wait for culture to result- atleast to have preliminary results before starting IV antibiotic as if the culture remains neg she will need to have another aspiration or surgical intervention for diagnosis Discussed the management with patient and her husband today. IF her clincally situation changes and if she becomes unstable then to start vanco + ceftriaxone

## 2019-07-06 NOTE — Progress Notes (Signed)
[facility] at [place] NAME: Dana Lewis    MR#:  [id number]  DATE OF BIRTH:  [birth year]  SUBJECTIVE:  patient eating lunch when seen earlier after her procedure with interventional radiology. Husband at bedside. REVIEW OF SYSTEMS:   Review of Systems  Constitutional: Negative for chills, fever and weight loss.  HENT: Negative for ear discharge, ear pain and nosebleeds.   Eyes: Negative for blurred vision, pain and discharge.  Respiratory: Negative for sputum production, shortness of breath, wheezing and stridor.   Cardiovascular: Negative for chest pain, palpitations, orthopnea and PND.  Gastrointestinal: Negative for abdominal pain, diarrhea, nausea and vomiting.  Genitourinary: Negative for frequency and urgency.  Musculoskeletal: Positive for back pain. Negative for joint pain.  Neurological: Negative for sensory change, speech change, focal weakness and weakness.  Psychiatric/Behavioral: Negative for depression and hallucinations. The patient is not nervous/anxious.    Tolerating Diet:yes Tolerating PT:   DRUG ALLERGIES:   Allergies  Allergen Reactions  . Blueberry Flavor Anaphylaxis  . Cat Hair Extract Dermatitis, Itching, Shortness Of Breath and Swelling  . Mold Extract [Trichophyton] Shortness Of Breath  . Fire [employer] and Swelling    VITALS:  Blood pressure 101/61, pulse 84, temperature 98.1 F (36.7 C), temperature source Oral, resp. rate 12, height _0  (1.6 m), weight 95.3 kg, last menstrual period 05/31/2019, SpO2 96 %.  PHYSICAL EXAMINATION:   Physical Exam  GENERAL:  44 y.o.-year-old patient lying in the bed with no acute distress.  EYES: Pupils equal, round, reactive to light and accommodation. No scleral icterus.   HEENT: Head atraumatic, normocephalic. Oropharynx and nasopharynx clear.  NECK:  Supple, no jugular venous distention. No thyroid enlargement, no tenderness.  LUNGS: Normal breath sounds  bilaterally, no wheezing, rales, rhonchi. No use of accessory muscles of respiration.  CARDIOVASCULAR: S1, S2 normal. No murmurs, rubs, or gallops.  ABDOMEN: Soft, nontender, nondistended. Bowel sounds present. No organomegaly or mass.  EXTREMITIES: No cyanosis, clubbing or edema b/l.    NEUROLOGIC: Cranial nerves II through XII are intact. No focal Motor or sensory deficits b/l.   PSYCHIATRIC:  patient is alert and oriented x 3.  SKIN: No obvious rash, lesion, or ulcer.   LABORATORY PANEL:  CBC Recent Labs  Lab 07/05/19 0443  WBC 12.4*  HGB 12.5  HCT 38.1  PLT 543*    Chemistries  Recent Labs  Lab 07/04/19 1446 07/04/19 1446 07/05/19 0443  NA 137   < > 139  K 4.2   < > 4.2  CL 103   < > 102  CO2 25   < > 29  GLUCOSE 90   < > 84  BUN 12   < > 11  CREATININE 0.63   < > 0.76  CALCIUM 9.3   < > 8.7*  AST 15  --   --   ALT 32  --   --   ALKPHOS 73  --   --   BILITOT 0.5  --   --    < > = values in this interval not displayed.   Cardiac Enzymes No results for input(s): TROPONINI in the last 168 hours. RADIOLOGY:  IR Fluoro Guide Ndl Plmt / BX  Result Date: 07/06/2019 CLINICAL DATA:  Left low back pain. MR suggests septic arthritis left L5-S1 facet joint EXAM: LEFT L5-S1 FACET ASPIRATION UNDER FLUOROSCOPY FLUOROSCOPY TIME:  0.5 minute; 16 mgy TECHNIQUE: The procedure, risks (including but not limited to bleeding, infection,  organ damage ), benefits, and alternatives were explained to the patient. Questions regarding the procedure were encouraged and answered. The patient understands and consents to the procedure. Intravenous Fentanyl 142mg and Versed 237mwere administered as conscious sedation during continuous monitoring of the patient's level of consciousness and physiological / cardiorespiratory status by the radiology RN, with a total moderate sedation time of 8 minutes. An appropriate skin entry site was determined fluoroscopically and marked. Operator donned sterile  gloves and mask. Site prepped with chlorhexidine, draped in usual sterile fashion, and infiltrated locally with 1% lidocaine. An 18 gauge trocar needle was advanced to the posterior margin of the left L5-S1 facet under fluoroscopy. Cloudy somewhat viscous fluid returned spontaneously through the needle hub. 1.5 mL blood tinged purulent material were aspirated, sent for Gram stain and culture. The patient tolerated the procedure well. COMPLICATIONS: COMPLICATIONS None IMPRESSION: 1. Technically successful left L5-S1 facet aspiration under fluoroscopy, sample sent for Gram stain and culture. Electronically Signed   By: D Lucrezia Europe.D.   On: 07/06/2019 10:17   ASSESSMENT AND PLAN:  AlSequoyah Ramones a [age].o. female with medical history significant for Hx of PE, ADHD, anxiety, and hx of microdiscectomy who presents with concerns of L5- S1 septic arthropathy. She has been having left sided lumbar pain since beginning of .  Suspected L5-SI septic arthropathy -Blood cultures negative. - ESR, CRP 4.1  -Neurosurgery would like to avoid antibiotics until after aspiration unless she becomes septic  -s/p aspirate today by IR -appreciate ID input -PRN pain control Frequent neuro checks  Thrombocytosis  Reactive.  Continue to monitor.  History of anxiety Continue BuSpar  History of ADHD Hold stimulants for now while inpatient  DVT prophylaxis:SCDs Code Status: Full Family Communication: Plan discussed with patient and husband at bedside  disposition Plan: Home with at least 2 midnight stays  Consults called:  Admission status: inpatient     Status is: Inpatient  Remains inpatient appropriate because:Inpatient level of care appropriate due to severity of illness   Dispo: The patient is from: Home  Anticipated d/c is to: Home  Anticipated d/c date is: 3 days  Patient currently is not medically stable to d/c.      TOTAL TIME TAKING CARE  OF THIS PATIENT: *35 minutes.  >50% time spent on counselling and coordination of care  Note: This dictation was prepared with Dragon dictation along with smaller phrase technology. Any transcriptional errors that result from this process are unintentional.  SoFritzi Mandes.D    [facility]   CC: Primary care physician; BeBarbaraann BoysMDPatient ID: AlCamille Balfemale   DOB: [birth year].o.   MRN: [id number]

## 2019-07-07 ENCOUNTER — Inpatient Hospital Stay: Payer: Self-pay

## 2019-07-07 DIAGNOSIS — M0008 Staphylococcal arthritis, vertebrae: Secondary | ICD-10-CM

## 2019-07-07 LAB — CREATININE, SERUM
Creatinine, Ser: 0.73 mg/dL (ref 0.44–1.00)
GFR calc Af Amer: >60 mL/min (ref >60)
GFR calc non Af Amer: >60 mL/min (ref >60)

## 2019-07-07 MED ORDER — SODIUM CHLORIDE 0.9 % IV SOLN
2.0000 g | INTRAVENOUS | Status: DC
  Administered 2019-07-07 – 2019-07-08 (×6): 2 g via INTRAVENOUS
  Filled 2019-07-07: qty 2
  Filled 2019-07-07: qty 2000
  Filled 2019-07-07: qty 2
  Filled 2019-07-07 (×3): qty 2000
  Filled 2019-07-07 (×4): qty 2

## 2019-07-07 MED ORDER — NAFCILLIN SODIUM 2 G IJ SOLR
2.0000 g | INTRAMUSCULAR | Status: DC
  Filled 2019-07-07 (×3): qty 2000

## 2019-07-07 MED ORDER — VANCOMYCIN HCL 2000 MG/400ML IV SOLN
2000.0000 mg | Freq: Once | INTRAVENOUS | Status: AC
  Administered 2019-07-07: 2000 mg via INTRAVENOUS
  Filled 2019-07-07: qty 400

## 2019-07-07 MED ORDER — VANCOMYCIN HCL IN DEXTROSE 1-5 GM/200ML-% IV SOLN
1000.0000 mg | Freq: Three times a day (TID) | INTRAVENOUS | Status: DC
  Administered 2019-07-07 – 2019-07-08 (×2): 1000 mg via INTRAVENOUS
  Filled 2019-07-07 (×4): qty 200

## 2019-07-07 NOTE — Progress Notes (Signed)
Pharmacy Antibiotic Note  Dana Lewis is a 44 y.o. female admitted on 07/04/2019 with vertebral septic arthritis. Synovial fluid culture growing staph arueus. Pharmacy has been consulted for vancomycin and nafcillin dosing per ID recommendations.  Plan: Nafcillin 2gm IV Q4H  Vancomycin 2gm IV x 1, followed by 1gm IV Q12H  Height: 5\' 3"  (160 cm) Weight: 95.3 kg (210 lb 1.6 oz) IBW/kg (Calculated) : 52.4  Temp (24hrs), Avg:98.5 F (36.9 C), Min:98.1 F (36.7 C), Max:98.9 F (37.2 C)  Recent Labs  Lab 07/04/19 1446 07/05/19 0443  WBC 13.4* 12.4*  CREATININE 0.63 0.76  LATICACIDVEN 1.4  --     Estimated Creatinine Clearance: 98.6 mL/min (by C-G formula based on SCr of 0.76 mg/dL).    Allergies  Allergen Reactions  . Blueberry Flavor Anaphylaxis  . Cat Hair Extract Dermatitis, Itching, Shortness Of Breath and Swelling  . Mold Extract [Trichophyton] Shortness Of Breath  . Fire [employer] and Swelling    Antimicrobials this admission: 5/22 vancomycin >>  5/22 nafcillin >>   Dose adjustments this admission:   Microbiology results: 5/19 BCx: NGTD 5/21 BCx: NGTD 5/21 Synovial fluid: few staph aureus,   Thank you for allowing pharmacy to be a part of this patient's care.  Kaelynn Igo C 07/07/2019 11:58 AM

## 2019-07-07 NOTE — Progress Notes (Signed)
[facility] at [place] NAME: Dana Lewis    MR#:  [id number]  DATE OF BIRTH:  1975/04/11  SUBJECTIVE:  C/o back pain--no fever Husband at bedside. REVIEW OF SYSTEMS:   Review of Systems  Constitutional: Negative for chills, fever and weight loss.  HENT: Negative for ear discharge, ear pain and nosebleeds.   Eyes: Negative for blurred vision, pain and discharge.  Respiratory: Negative for sputum production, shortness of breath, wheezing and stridor.   Cardiovascular: Negative for chest pain, palpitations, orthopnea and PND.  Gastrointestinal: Negative for abdominal pain, diarrhea, nausea and vomiting.  Genitourinary: Negative for frequency and urgency.  Musculoskeletal: Positive for back pain. Negative for joint pain.  Neurological: Negative for sensory change, speech change, focal weakness and weakness.  Psychiatric/Behavioral: Negative for depression and hallucinations. The patient is not nervous/anxious.    Tolerating Diet:yes Tolerating PT: patient is ambulatory  DRUG ALLERGIES:   Allergies  Allergen Reactions  . Blueberry Flavor Anaphylaxis  . Cat Hair Extract Dermatitis, Itching, Shortness Of Breath and Swelling  . Mold Extract [Trichophyton] Shortness Of Breath  . [employer] and Swelling    VITALS:  Blood pressure 99/69, pulse 87, temperature 98.1 F (36.7 C), temperature source Oral, resp. rate 14, height 5' 3" (1.6 m), weight 95.3 kg, last menstrual period 05/31/2019, SpO2 98 %.  PHYSICAL EXAMINATION:   Physical Exam  GENERAL:  44 y.o.-year-old patient lying in the bed with no acute distress.  EYES: Pupils equal, round, reactive to light and accommodation. No scleral icterus.   HEENT: Head atraumatic, normocephalic. Oropharynx and nasopharynx clear.  NECK:  Supple, no jugular venous distention. No thyroid enlargement, no tenderness.  LUNGS: Normal breath sounds bilaterally, no wheezing, rales, rhonchi. No use of  accessory muscles of respiration.  CARDIOVASCULAR: S1, S2 normal. No murmurs, rubs, or gallops.  ABDOMEN: Soft, nontender, nondistended. Bowel sounds present. No organomegaly or mass.  EXTREMITIES: No cyanosis, clubbing or edema b/l.    NEUROLOGIC: Cranial nerves II through XII are intact. No focal Motor or sensory deficits b/l.   PSYCHIATRIC:  patient is alert and oriented x 3.  SKIN: No obvious rash, lesion, or ulcer.   LABORATORY PANEL:  CBC Recent Labs  Lab 07/05/19 0443  WBC 12.4*  HGB 12.5  HCT 38.1  PLT 543*    Chemistries  Recent Labs  Lab 07/04/19 1446 07/04/19 1446 07/05/19 0443  NA 137   < > 139  K 4.2   < > 4.2  CL 103   < > 102  CO2 25   < > 29  GLUCOSE 90   < > 84  BUN 12   < > 11  CREATININE 0.63   < > 0.76  CALCIUM 9.3   < > 8.7*  AST 15  --   --   ALT 32  --   --   ALKPHOS 73  --   --   BILITOT 0.5  --   --    < > = values in this interval not displayed.   Cardiac Enzymes No results for input(s): TROPONINI in the last 168 hours. RADIOLOGY:  IR Fluoro Guide Ndl Plmt / BX  Result Date: 07/06/2019 CLINICAL DATA:  Left low back pain. MR suggests septic arthritis left L5-S1 facet joint EXAM: LEFT L5-S1 FACET ASPIRATION UNDER FLUOROSCOPY FLUOROSCOPY TIME:  0.5 minute; 16 mgy TECHNIQUE: The procedure, risks (including but not limited to bleeding, infection, organ damage ), benefits, and alternatives  were explained to the patient. Questions regarding the procedure were encouraged and answered. The patient understands and consents to the procedure. Intravenous Fentanyl 166mg and Versed 219mwere administered as conscious sedation during continuous monitoring of the patient's level of consciousness and physiological / cardiorespiratory status by the radiology RN, with a total moderate sedation time of 8 minutes. An appropriate skin entry site was determined fluoroscopically and marked. Operator donned sterile gloves and mask. Site prepped with chlorhexidine, draped  in usual sterile fashion, and infiltrated locally with 1% lidocaine. An 18 gauge trocar needle was advanced to the posterior margin of the left L5-S1 facet under fluoroscopy. Cloudy somewhat viscous fluid returned spontaneously through the needle hub. 1.5 mL blood tinged purulent material were aspirated, sent for Gram stain and culture. The patient tolerated the procedure well. COMPLICATIONS: COMPLICATIONS None IMPRESSION: 1. Technically successful left L5-S1 facet aspiration under fluoroscopy, sample sent for Gram stain and culture. Electronically Signed   By: D Lucrezia Europe.D.   On: 07/06/2019 10:17   ASSESSMENT AND PLAN:  Dana Lewis a [age].o. female with medical history significant for Hx of PE, ADHD, anxiety, and hx of microdiscectomy who presents with concerns of L5- S1 septic arthropathy. She has been having left sided lumbar pain since beginning of .  Suspected L5-SI septic arthropathy -Blood cultures negative. - ESR, CRP 4.1  -Neurosurgery would like to avoid antibiotics until after aspiration unless she becomes septic  -s/p aspirate today by IR-- Gram stain positive for few staph aureus -appreciate ID input-- recommend start IV vancomycin and nafcillin-- pharmacy consultation -PRN pain control  Thrombocytosis  Reactive.  Continue to monitor.  History of anxiety Continue BuSpar  History of ADHD   DVT prophylaxis:SCDs Code Status: Full Family Communication: Plan discussed with patient and husband at bedside  disposition Plan: Home with at least 2 midnight stays  Consults called: ID Admission status: inpatient     Status is: Inpatient  Remains inpatient appropriate because:Inpatient level of care appropriate due to severity of illness -patient started on IV antibiotics today. Will need to determine duration of antibiotics and possible PICC line placement. Will await final ID recommendations   Dispo: The patient is from: Home  Anticipated d/c is  to: Home  Anticipated d/c date is:>2- 3 days  Patient currently is not medically stable to d/c.      TOTAL TIME TAKING CARE OF THIS PATIENT: *25 minutes.  >50% time spent on counselling and coordination of care  Note: This dictation was prepared with Dragon dictation along with smaller phrase technology. Any transcriptional errors that result from this process are unintentional.  SoFritzi Mandes.D    [facility]   CC: Primary care physician; BeBarbaraann BoysMDPatient ID: AlCamille Balfemale   DOB: 04/1975-10-034478.o.   MRN: [id number]

## 2019-07-07 NOTE — Progress Notes (Signed)
Pharmacy Antibiotic Note  Dana Lewis is a 44 y.o. female admitted on 07/04/2019 with vertebral septic arthritis. Synovial fluid culture growing staph arueus. Pharmacy has been consulted for vancomycin and nafcillin dosing per ID recommendations.  Plan: Nafcillin 2gm IV Q4H  Vancomycin 2gm IV x 1, followed by 1gm IV Q8H based on current renal function  Height: 5\' 3"  (160 cm) Weight: 95.3 kg (210 lb 1.6 oz) IBW/kg (Calculated) : 52.4  Temp (24hrs), Avg:98.5 F (36.9 C), Min:98.1 F (36.7 C), Max:98.9 F (37.2 C)  Recent Labs  Lab 07/04/19 1446 07/05/19 0443 07/07/19 1519  WBC 13.4* 12.4*  --   CREATININE 0.63 0.76 0.73  LATICACIDVEN 1.4  --   --     Estimated Creatinine Clearance: 98.6 mL/min (by C-G formula based on SCr of 0.73 mg/dL).    Allergies  Allergen Reactions  . Blueberry Flavor Anaphylaxis  . Cat Hair Extract Dermatitis, Itching, Shortness Of Breath and Swelling  . Mold Extract [Trichophyton] Shortness Of Breath  . Fire [employer] and Swelling    Antimicrobials this admission: 5/22 vancomycin >>  5/22 nafcillin >>   Dose adjustments this admission:   Microbiology results: 5/19 BCx: NGTD 5/21 BCx: NGTD 5/21 Synovial fluid: few staph aureus,   Thank you for allowing pharmacy to be a part of this patient's care.  Tawnya Crook, PharmD 07/07/2019 3:57 PM

## 2019-07-07 NOTE — Progress Notes (Addendum)
Spoke with Dr Delaine Lame re PICC placement.  States ok to proceed with PICC.  Will need IV OPAT.  Robyn RN notified will plan on PICC placement 5/23.

## 2019-07-07 NOTE — Progress Notes (Signed)
ID Staph aureus from L5-s1 fluid culture Final ID still pending So will cover for both MRSA and MSSA- Will you both vanco and nafcillin Pt can have PICC

## 2019-07-08 DIAGNOSIS — A4901 Methicillin susceptible Staphylococcus aureus infection, unspecified site: Secondary | ICD-10-CM

## 2019-07-08 LAB — CREATININE, SERUM
Creatinine, Ser: 0.79 mg/dL (ref 0.44–1.00)
GFR calc Af Amer: >60 mL/min (ref >60)
GFR calc non Af Amer: >60 mL/min (ref >60)

## 2019-07-08 MED ORDER — MORPHINE SULFATE (PF) 2 MG/ML IV SOLN
2.0000 mg | INTRAVENOUS | Status: AC
  Administered 2019-07-08: 2 mg via INTRAVENOUS

## 2019-07-08 MED ORDER — SODIUM CHLORIDE 0.9 % IV SOLN
12.0000 g | INTRAVENOUS | Status: DC
  Administered 2019-07-08: 12 g via INTRAVENOUS
  Filled 2019-07-08 (×3): qty 12000

## 2019-07-08 MED ORDER — OXYCODONE HCL 5 MG PO TABS: 10.0000 mg | ORAL_TABLET | ORAL | Status: DC | PRN

## 2019-07-08 MED ORDER — LIDOCAINE 5 % EX PTCH
1.0000 | MEDICATED_PATCH | CUTANEOUS | Status: DC
  Administered 2019-07-08: 1 via TRANSDERMAL
  Filled 2019-07-08 (×2): qty 1

## 2019-07-08 MED ORDER — CHLORHEXIDINE GLUCONATE CLOTH 2 % EX PADS: 6.0000 | MEDICATED_PAD | Freq: Every day | CUTANEOUS | Status: DC

## 2019-07-08 MED ORDER — ALUM & MAG HYDROXIDE-SIMETH 200-200-20 MG/5ML PO SUSP
30.0000 mL | Freq: Four times a day (QID) | ORAL | Status: DC | PRN
  Administered 2019-07-08 – 2019-07-09 (×3): 30 mL via ORAL
  Filled 2019-07-08 (×3): qty 30

## 2019-07-08 NOTE — Progress Notes (Addendum)
Nurse reports patient with severe pain, no relief from prn morphine given, pain poorly controlled on current oral regimen.  Oral regimen increase to oxycodone 10 every 4h as needed for severe pain and lidocaine patch added  At bedside patient reports originally today her pain had improved, controlled, was able to lay on her back and then developed severe acut pain about 1.5 hours ago after eating dinner. Patient is s/p aspiration in IR done today  Additional morphine dose was ordered for now and stat MRI   Patient had refused MRI and stated she felt that her pain was related to gas as she felt some relief after passing flatus.  Simethicone ordered.

## 2019-07-08 NOTE — Consult Note (Signed)
PHARMACY CONSULT NOTE FOR:  OUTPATIENT  PARENTERAL ANTIBIOTIC THERAPY (OPAT)  Indication: Staph aureus l5-s1 septic arthritis of the facet joint Regimen: Nafcillin 12 g in a continuous infusion every 24 hours End date: 08/15/19  IV antibiotic discharge orders are pended. To discharging provider:  please sign these orders via discharge navigator,  Select New Orders & click on the button choice - Manage This Unsigned Work.     Thank you for allowing pharmacy to be a part of this patient's care.  [place] Resident 07/08/2019, 3:05 PM

## 2019-07-08 NOTE — Progress Notes (Signed)
Peripherally Inserted Central Catheter Placement  The IV Nurse has discussed with the patient and/or persons authorized to consent for the patient, the purpose of this procedure and the potential benefits and risks involved with this procedure.  The benefits include less needle sticks, lab draws from the catheter, and the patient may be discharged home with the catheter. Risks include, but not limited to, infection, bleeding, blood clot (thrombus formation), and puncture of an artery; nerve damage and irregular heartbeat and possibility to perform a PICC exchange if needed/ordered by physician.  Alternatives to this procedure were also discussed.  Bard Power PICC patient education guide, fact sheet on infection prevention and patient information card has been provided to patient /or left at bedside.    PICC Placement Documentation  PICC Single Lumen 07/08/19 PICC Right Brachial 40 cm 1 cm (Active)  Indication for Insertion or Continuance of Line Home intravenous therapies (PICC only) 07/08/19 1123  Exposed Catheter (cm) 1 cm 07/08/19 1123  Site Assessment Clean;Dry;Intact 07/08/19 1123  Line Status Flushed;Saline locked;Blood return noted 07/08/19 1123  Dressing Type Transparent 07/08/19 1123  Dressing Status Clean;Dry;Intact 07/08/19 1123  Dressing Intervention New dressing 07/08/19 1123  Dressing Change Due 07/15/19 07/08/19 1123       Saleh Ulbrich, Nicolette Bang 07/08/2019, 11:24 AM

## 2019-07-08 NOTE — Progress Notes (Signed)
Approx. 9:20pm patient noted crying out in pain, upon entering pt room pt reports IV Morphine given previously not aiding in pain and pain has since increased to 10/10, pt noted crying and unable to find comfortable position, reassurance provided to pt and repositioning attempted to aid in pain/discomfort, spoke with Sharion Settler, NP on unit to report pain regimen currently ineffective and pt pain not controlled at this time, NP ordered Oxycodone 10mg  po q4h prn for severe pain, Lidocaine patch to lower back and Morphine 2mg  IV stat, stat MRI ordered, Morphine 2mg  IV stat given per order, pt declined MRI stating she is unable to lay flat on back at this time and fears pain will increase with change in position, NP and MRI made aware pt declining MRI at this time, pt now reporting 4/10 pain to lower back, pt aided in repositioning to comfortable position and Lidocaine patch applied per order, pt now resting comfortably in bed with bed in low safe position and call bell within reach, will continue to monitor pt this shift and report any concerns or change in status to NP.

## 2019-07-08 NOTE — Treatment Plan (Signed)
Diagnosis: Staph aureus l5-s1 septic arthritis of the facet joint Baseline Creatinine -0.79    Allergies  Allergen Reactions  . Blueberry Flavor Anaphylaxis  . Cat Hair Extract Dermatitis, Itching, Shortness Of Breath and Swelling  . Mold Extract [Trichophyton] Shortness Of Breath  . Fire Ant Hives and Swelling    OPAT Orders Discharge antibiotics: Nafcillin 12 grams in a continuous infusion every 24 hours   End Date:08/15/19  PIC Care Per Protocol:  Labs weekly on Thursday while on IV antibiotics: _X_ CBC with differential  _X_ CMP  _X_ ESR   _X_ Please pull PIC at completion of IV antibiotics   Fax weekly labs to Dr.Ravishankar [phone]  Clinic Follow Up Appt: 3 weeks   Call [phone] to make appt   

## 2019-07-08 NOTE — Progress Notes (Signed)
ID  Pt feeling much better  Pain left lower back better 2/10 Never had fever Moving in bed better, walking  Patient Vitals for the past 24 hrs:  BP Temp Temp src Pulse Resp SpO2  07/08/19 0724 133/73 99.1 F (37.3 C) Oral 75 16 97 %  07/08/19 0022 (!) 98/54 98.7 F (37.1 C) Oral 82 16 99 %  07/07/19 1612 111/69 98.7 F (37.1 C) Oral 85 14 100 %   0/e well, no distress Chest b/l air entry HS s1s2 abd soft tenderness /warmth left sacral area much better  CMP Latest Ref Rng & Units 07/08/2019 07/07/2019 07/05/2019  Glucose 70 - 99 mg/dL - - 84  BUN 6 - 20 mg/dL - - 11  Creatinine 0.44 - 1.00 mg/dL 0.79 0.73 0.76  Sodium 135 - 145 mmol/L - - 139  Potassium 3.5 - 5.1 mmol/L - - 4.2  Chloride 98 - 111 mmol/L - - 102  CO2 22 - 32 mmol/L - - 29  Calcium 8.9 - 10.3 mg/dL - - 8.7(L)  Total Protein 6.5 - 8.1 g/dL - - -  Total Bilirubin 0.3 - 1.2 mg/dL - - -  Alkaline Phos 38 - 126 U/L - - -  AST 15 - 41 U/L - - -  ALT 0 - 44 U/L - - -    CBC Latest Ref Rng & Units 07/05/2019 07/04/2019 09/19/2017  WBC 4.0 - 10.5 K/uL 12.4(H) 13.4(H) 10.2  Hemoglobin 12.0 - 15.0 g/dL 12.5 13.5 13.7  Hematocrit 36.0 - 46.0 % 38.1 41.6 39.9  Platelets 150 - 400 K/uL 543(H) 591(H) 312       Impression/recommendation  L5-s1 facet joint septic arthritis due to MSSA Continue nafcillin ( vancomycin  discontinued) She will need 4-6 weeks  source unclear- she says she gets skin lesions on her face- will do nares swab for mssa Discussed the management with patient and also talked about nafcillin sid effects and getting it as a continuous infusion 2nd option cefazolin discussed as well- she wants to continue nafcillin

## 2019-07-08 NOTE — Progress Notes (Signed)
[name] NAME: Dana Lewis    MR#:  [id number]  DATE OF BIRTH:  October 25, 1975  SUBJECTIVE:  C/o intermittent  back pain--no fever Husband at bedside. Eating well REVIEW OF SYSTEMS:   Review of Systems  Constitutional: Negative for chills, fever and weight loss.  HENT: Negative for ear discharge, ear pain and nosebleeds.   Eyes: Negative for blurred vision, pain and discharge.  Respiratory: Negative for sputum production, shortness of breath, wheezing and stridor.   Cardiovascular: Negative for chest pain, palpitations, orthopnea and PND.  Gastrointestinal: Negative for abdominal pain, diarrhea, nausea and vomiting.  Genitourinary: Negative for frequency and urgency.  Musculoskeletal: Positive for back pain. Negative for joint pain.  Neurological: Negative for sensory change, speech change, focal weakness and weakness.  Psychiatric/Behavioral: Negative for depression and hallucinations. The patient is not nervous/anxious.    Tolerating Diet:yes Tolerating PT: patient is ambulatory  DRUG ALLERGIES:   Allergies  Allergen Reactions  . Blueberry Flavor Anaphylaxis  . Cat Hair Extract Dermatitis, Itching, Shortness Of Breath and Swelling  . Mold Extract [Trichophyton] Shortness Of Breath  . Fire [employer] and Swelling    VITALS:  Blood pressure 133/73, pulse 75, temperature 99.1 F (37.3 C), temperature source Oral, resp. rate 16, height 5\' 3"  (1.6 m), weight 95.3 kg, last menstrual period 05/31/2019, SpO2 97 %.  PHYSICAL EXAMINATION:   Physical Exam  GENERAL:  44 y.o.-year-old patient lying in the bed with no acute distress.  EYES: Pupils equal, round, reactive to light and accommodation. No scleral icterus.   HEENT: Head atraumatic, normocephalic. Oropharynx and nasopharynx clear.  NECK:  Supple, no jugular venous distention. No thyroid enlargement, no tenderness.  LUNGS: Normal breath sounds bilaterally, no wheezing,  rales, rhonchi. No use of accessory muscles of respiration.  CARDIOVASCULAR: S1, S2 normal. No murmurs, rubs, or gallops.  ABDOMEN: Soft, nontender, nondistended. Bowel sounds present. No organomegaly or mass.  EXTREMITIES: No cyanosis, clubbing or edema b/l.    NEUROLOGIC: Cranial nerves II through XII are intact. No focal Motor or sensory deficits b/l.   PSYCHIATRIC:  patient is alert and oriented x 3.  SKIN: No obvious rash, lesion, or ulcer.   LABORATORY PANEL:  CBC Recent Labs  Lab 07/05/19 0443  WBC 12.4*  HGB 12.5  HCT 38.1  PLT 543*    Chemistries  Recent Labs  Lab 07/04/19 1446 07/04/19 1446 07/05/19 0443 07/07/19 1519 07/08/19 0500  NA 137   < > 139  --   --   K 4.2   < > 4.2  --   --   CL 103   < > 102  --   --   CO2 25   < > 29  --   --   GLUCOSE 90   < > 84  --   --   BUN 12   < > 11  --   --   CREATININE 0.63   < > 0.76   < > 0.79  CALCIUM 9.3   < > 8.7*  --   --   AST 15  --   --   --   --   ALT 32  --   --   --   --   ALKPHOS 73  --   --   --   --   BILITOT 0.5  --   --   --   --    < > =  values in this interval not displayed.   Cardiac Enzymes No results for input(s): TROPONINI in the last 168 hours. RADIOLOGY:  IR Fluoro Guide Ndl Plmt / BX  Result Date: 07/06/2019 CLINICAL DATA:  Left low back pain. MR suggests septic arthritis left L5-S1 facet joint EXAM: LEFT L5-S1 FACET ASPIRATION UNDER FLUOROSCOPY FLUOROSCOPY TIME:  0.5 minute; 16 mgy TECHNIQUE: The procedure, risks (including but not limited to bleeding, infection, organ damage ), benefits, and alternatives were explained to the patient. Questions regarding the procedure were encouraged and answered. The patient understands and consents to the procedure. Intravenous Fentanyl 114mcg and Versed 2mg  were administered as conscious sedation during continuous monitoring of the patient's level of consciousness and physiological / cardiorespiratory status by the radiology RN, with a total moderate  sedation time of 8 minutes. An appropriate skin entry site was determined fluoroscopically and marked. Operator donned sterile gloves and mask. Site prepped with chlorhexidine, draped in usual sterile fashion, and infiltrated locally with 1% lidocaine. An 18 gauge trocar needle was advanced to the posterior margin of the left L5-S1 facet under fluoroscopy. Cloudy somewhat viscous fluid returned spontaneously through the needle hub. 1.5 mL blood tinged purulent material were aspirated, sent for Gram stain and culture. The patient tolerated the procedure well. COMPLICATIONS: COMPLICATIONS None IMPRESSION: 1. Technically successful left L5-S1 facet aspiration under fluoroscopy, sample sent for Gram stain and culture. Electronically Signed   By: Dana Lewis M.D.   On: 07/06/2019 10:17   [place] EKG SITE RITE  Result Date: 07/07/2019 If [facility] image not attached, placement could not be confirmed due to current cardiac rhythm.  ASSESSMENT AND PLAN:  Dana Lewis is a 44 y.o. female with medical history significant for Hx of PE, ADHD, anxiety, and hx of microdiscectomy who presents with concerns of L5- S1 septic arthropathy. She has been having left sided lumbar pain since beginning of .  MSSA L5-SI septic arthropathy -Blood cultures negative. - ESRelevated, CRP 4.1  -s/p aspirate today by IR-- Methicillin sensitive staph aureus -appreciate ID input-- recommend start IV vancomycin and nafcillin-- pharmacy consultation -PICC line today will need 4-6 weeks IV abxs at home -PRN pain control  Thrombocytosis  Reactive.  Continue to monitor.  History of anxiety Continue BuSpar  History of ADHD   DVT prophylaxis:SCDs Code Status: Full Family Communication: Plan discussed with patient and husband at bedside  disposition Plan: Home with PICC for IV abxs likely 5/24 Consults called: ID Admission status: inpatient  Status is: Inpatient  Remains inpatient appropriate because:Inpatient level of  care appropriate due to severity of illness -patient  on IV antibiotics . Will need to determine duration of antibiotics and PICC line placement today. Will await final ID recommendations for duration of abxs -TOC consult for IV abxs  Dispo: The patient is from: Home  Anticipated d/c is to: Home  Anticipated d/c date [id number]  Patient currently is not medically stable to d/c.   TOTAL TIME TAKING CARE OF THIS PATIENT: *25 minutes.  >50% time spent on counselling and coordination of care  Note: This dictation was prepared with Dragon dictation along with smaller phrase technology. Any transcriptional errors that result from this process are unintentional.  Fritzi Mandes M.D    [facility]   CC: Primary care physician; Barbaraann Boys, MDPatient ID: Dana Lewis, female   DOB: [birth year], 44 y.o.   MRN: [id number]

## 2019-07-09 LAB — CULTURE, BLOOD (SINGLE)
Culture: NO GROWTH
Special Requests: ADEQUATE

## 2019-07-09 MED ORDER — OXYCODONE HCL 10 MG PO TABS: 10.0000 mg | ORAL_TABLET | Freq: Four times a day (QID) | ORAL | 0 refills | Status: DC | PRN

## 2019-07-09 MED ORDER — LIDOCAINE 5 % EX PTCH: 1.0000 | MEDICATED_PATCH | CUTANEOUS | 0 refills | Status: DC

## 2019-07-09 MED ORDER — NAFCILLIN IV (FOR PTA / DISCHARGE USE ONLY)
12.0000 g | INTRAVENOUS | 0 refills | Status: AC
Start: 2019-07-09 — End: 2019-08-16

## 2019-07-09 NOTE — Discharge Summary (Addendum)
[facility] NAME: Dana Lewis    MR#:  [id number]  DATE OF BIRTH:  [birth year]  DATE OF ADMISSION:  07/04/2019 ADMITTING PHYSICIAN: Orene Desanctis, DO  DATE OF DISCHARGE: 07/09/2019  PRIMARY CARE PHYSICIAN: Barbaraann Boys, MD    ADMISSION DIAGNOSIS:  Spinal abscess ([place]) [M46.20] Septic arthritis of vertebra, due to unspecified organism ([place]) [M46.50]  DISCHARGE DIAGNOSIS:  L5-S1 Facet joint MSSA Septic Arthritis  SECONDARY DIAGNOSIS:   Past Medical History:  Diagnosis Date  . ADHD   . Back pain   . Pneumonia   . Pulmonary embolism [facility])     HOSPITAL COURSE:  Dana Lewis a 44 y.o.femalewith medical history significant forHx of PE, ADHD, anxiety, and hx of microdiscectomy who presents with concerns of L5- S1 septic arthropathy. She has been having left sided lumbar pain since beginning of .  MSSA L5-SI septic arthropathy -Blood cultures negative. - ESR elevated, CRP 4.1  -s/p aspirate today by IR-- Methicillin sensitive staph aureus -appreciate ID input-- recommends continuous IV  Nafcillin infusion  (end date 08/15/19) -PICC line placed on 07/08/19 -PRN pain control with lidocaine patch and po oxycodone  Thrombocytosis Reactive.   History of anxiety Continue BuSpar  History of ADHD   DVT prophylaxis:SCDs Code Status: Full Family Communication: Plan discussed with patient and husband at bedside  disposition Plan: Home with PICC for IV abxs today Consults called: ID Admission status: inpatient  Status is: Inpatient -TOC consult for IV abxs  Dispo: patient is from:Home Anticipated d/c is LA:GTXM Anticipated d/c date is:07/09/19 Patient currentlyis medically stable to d/c.   CONSULTS OBTAINED:  Treatment Team:  Tsosie Billing, MD  DRUG ALLERGIES:   Allergies  Allergen Reactions  . Blueberry Flavor Anaphylaxis  . Cat Hair Extract  Dermatitis, Itching, Shortness Of Breath and Swelling  . Mold Extract [Trichophyton] Shortness Of Breath  . [employer] and Swelling    DISCHARGE MEDICATIONS:   Allergies as of 07/09/2019      Reactions   Blueberry Flavor Anaphylaxis   Cat Hair Extract Dermatitis, Itching, Shortness Of Breath, Swelling   Mold Extract [trichophyton] Shortness Of Breath   Fire Ant Hives, Swelling      Medication List    STOP taking these medications   gabapentin 100 MG capsule Commonly known as: NEURONTIN   HYDROcodone-acetaminophen 5-325 MG tablet Commonly known as: Norco     TAKE these medications   aspirin 325 MG tablet Take 325 mg by mouth daily.   busPIRone 10 MG tablet Commonly known as: BUSPAR Take 10 mg by mouth 2 (two) times daily.   dextroamphetamine 10 MG tablet Commonly known as: DEXTROSTAT Take 10 mg by mouth 3 (three) times daily.   docusate sodium 100 MG capsule Commonly known as: COLACE Take 1 capsule (100 mg total) by mouth every 12 (twelve) hours.   fluticasone 50 MCG/ACT nasal spray Commonly known as: FLONASE Place 1 spray into both nostrils daily.   lidocaine 5 % Commonly known as: LIDODERM Place 1 patch onto the skin daily. Remove & Discard patch within 12 hours or as directed by MD   lisdexamfetamine 70 MG capsule Commonly known as: VYVANSE Take 70 mg by mouth daily.   montelukast 10 MG tablet Commonly known as: SINGULAIR Take 10 mg by mouth daily.   nafcillin  IVPB Inject 12 g into the vein continuous. Indication: Staph aureus l5-s1 septic arthritis of the facet joint Medication: Nafcillin 12 g IV as a  continuous infusion over 24 hours Last Day of Therapy: 08/15/19 [facility] Care Per Protocol: Labs weekly on Thursday while on IV antibiotics: _X_ CBC with differential _X_ CMP _X_ ESR _X_ Please pull PIC at completion of IV antibiotics Fax weekly labs to Dr.Ravishankar [phone] Clinic Follow Up Appt: 3 weeks Call [phone] to make  appt Method of administration: Elastomeric (Continuous infusion) Method of administration may be changed at the discretion of home infusion pharmacist based upon assessment of the patient and/or caregiver's ability to self-administer the medication ordered.   Oxycodone HCl 10 MG Tabs Take 1 tablet (10 mg total) by mouth every 6 (six) hours as needed for severe pain or breakthrough pain. What changed:   medication strength  how much to take  when to take this  reasons to take this            Discharge Care Instructions  (From admission, onward)         Start     Ordered   07/09/19 0000  Change dressing on IV access line weekly and PRN  (Home infusion instructions - [facility] )     07/09/19 0913          If you experience worsening of your admission symptoms, develop shortness of breath, life threatening emergency, suicidal or homicidal thoughts you must seek medical attention immediately by calling 911 or calling your MD immediately  if symptoms less severe.  You Must read complete instructions/literature along with all the possible adverse reactions/side effects for all the Medicines you take and that have been prescribed to you. Take any new Medicines after you have completely understood and accept all the possible adverse reactions/side effects.   Please note  You were cared for by a hospitalist during your hospital stay. If you have any questions about your discharge medications or the care you received while you were in the hospital after you are discharged, you can call the unit and asked to speak with the hospitalist on call if the hospitalist that took care of you is not available. Once you are discharged, your primary care physician will handle any further medical issues. Please note that NO REFILLS for any discharge medications will be authorized once you are discharged, as it is imperative that you return to your primary care physician (or establish a  relationship with a primary care physician if you do not have one) for your aftercare needs so that they can reassess your need for medications and monitor your lab values. Today   SUBJECTIVE   C/o back pain last nite--she is better after having flatus No fever  VITAL SIGNS:  Blood pressure 128/80, pulse 80, temperature (!) 97.4 F (36.3 C), temperature source Oral, resp. rate 17, height '5\' 3"'$  (1.6 m), weight 95.3 kg, last menstrual period 05/31/2019, SpO2 98 %.  I/O:    Intake/Output Summary (Last 24 hours) at 07/09/2019 0918 Last data filed at 07/08/2019 1557 Gross per 24 hour  Intake 789.96 ml  Output --  Net 789.96 ml    PHYSICAL EXAMINATION:  GENERAL:  44 y.o.-year-old patient lying in the bed with no acute distress. obese EYES: Pupils equal, round, reactive to light and accommodation. No scleral icterus.  HEENT: Head atraumatic, normocephalic. Oropharynx and nasopharynx clear.  NECK:  Supple, no jugular venous distention. No thyroid enlargement, no tenderness.  LUNGS: Normal breath sounds bilaterally, no wheezing, rales,rhonchi or crepitation. No use of accessory muscles of respiration.  CARDIOVASCULAR: S1, S2 normal. No murmurs, rubs,  or gallops.  ABDOMEN: Soft, non-tender, non-distended. Bowel sounds present. No organomegaly or mass.  EXTREMITIES: No pedal edema, cyanosis, or clubbing.  PICC right UE (placed on may 23rd) NEUROLOGIC: Cranial nerves II through XII are intact. Muscle strength 5/5 in all extremities. Sensation intact. Gait not checked.  PSYCHIATRIC: The patient is alert and oriented x 3.  SKIN: No obvious rash, lesion, or ulcer.   DATA REVIEW:   CBC  Recent Labs  Lab 07/05/19 0443  WBC 12.4*  HGB 12.5  HCT 38.1  PLT 543*    Chemistries  Recent Labs  Lab 07/04/19 1446 07/04/19 1446 07/05/19 0443 07/07/19 1519 07/08/19 0500  NA 137   < > 139  --   --   K 4.2   < > 4.2  --   --   CL 103   < > 102  --   --   CO2 25   < > 29  --   --    GLUCOSE 90   < > 84  --   --   BUN 12   < > 11  --   --   CREATININE 0.63   < > 0.76   < > 0.79  CALCIUM 9.3   < > 8.7*  --   --   AST 15  --   --   --   --   ALT 32  --   --   --   --   ALKPHOS 73  --   --   --   --   BILITOT 0.5  --   --   --   --    < > = values in this interval not displayed.    Microbiology Results   Recent Results (from the past 240 hour(s))  Blood culture (single)     Status: None   Collection Time: 07/04/19  2:43 PM   Specimen: BLOOD  Result Value Ref Range Status   Specimen Description BLOOD LEFT ANTECUBITAL  Final   Special Requests   Final    BOTTLES DRAWN AEROBIC AND ANAEROBIC Blood Culture adequate volume   Culture   Final    NO GROWTH 5 DAYS Performed at [facility], [address] [place], [place] [address]    Report Status 07/09/2019 FINAL  Final  SARS Coronavirus 2 by RT PCR (hospital order, performed in [facility] hospital lab) Nasopharyngeal Nasopharyngeal Swab     Status: None   Collection Time: 07/04/19  5:41 PM   Specimen: Nasopharyngeal Swab  Result Value Ref Range Status   SARS Coronavirus 2 NEGATIVE NEGATIVE Final    Comment: (NOTE) SARS-CoV-2 target nucleic acids are NOT DETECTED. The SARS-CoV-2 RNA is generally detectable in upper and lower respiratory specimens during the acute phase of infection. The lowest concentration of SARS-CoV-2 viral copies this assay can detect is 250 copies / mL. A negative result does not preclude SARS-CoV-2 infection and should not be used as the sole basis for treatment or other patient management decisions.  A negative result may occur with improper specimen collection / handling, submission of specimen other than nasopharyngeal swab, presence of viral mutation(s) within the areas targeted by this assay, and inadequate number of viral copies (<250 copies / mL). A negative result must be combined with clinical observations, patient history, and epidemiological information. Fact Sheet  for Patients:   StrictlyIdeas.no Fact Sheet for Healthcare Providers: BankingDealers.co.za This test is not yet approved or cleared  by the [place]  States FDA and has been authorized for detection and/or diagnosis of SARS-CoV-2 by FDA under an Emergency Use Authorization (EUA).  This EUA will remain in effect (meaning this test can be used) for the duration of the COVID-19 declaration under Section 564(b)(1) of the Act, 21 U.S.C. section 360bbb-3(b)(1), unless the authorization is terminated or revoked sooner. Performed at [facility], [address] [place], [place] [address]   CULTURE, BLOOD (ROUTINE X 2) w Reflex to ID Panel     Status: None (Preliminary result)   Collection Time: 07/06/19 12:17 AM   Specimen: BLOOD RIGHT HAND  Result Value Ref Range Status   Specimen Description BLOOD RIGHT HAND  Final   Special Requests   Final    BOTTLES DRAWN AEROBIC AND ANAEROBIC Blood Culture adequate volume   Culture   Final    NO GROWTH 3 DAYS Performed at [facility], [address] [place], [place] [address]    Report Status PENDING  Incomplete  CULTURE, BLOOD (ROUTINE X 2) w Reflex to ID Panel     Status: None (Preliminary result)   Collection Time: 07/06/19 12:29 AM   Specimen: BLOOD LEFT HAND  Result Value Ref Range Status   Specimen Description BLOOD LEFT HAND  Final   Special Requests   Final    BOTTLES DRAWN AEROBIC AND ANAEROBIC Blood Culture adequate volume   Culture   Final    NO GROWTH 3 DAYS Performed at [facility], [address] [place], [place] [address]    Report Status PENDING  Incomplete  Aerobic/Anaerobic Culture (surgical/deep wound)     Status: None (Preliminary result)   Collection Time: 07/06/19  9:04 AM   Specimen: Synovial Fluid  Result Value Ref Range Status   Specimen Description FLUID JOINT, OTHER FLUID  Final   Special Requests NONE  Final   Gram Stain   Final     ABUNDANT WBC PRESENT,BOTH PMN AND MONONUCLEAR NO ORGANISMS SEEN    Culture   Final    FEW STAPHYLOCOCCUS AUREUS CRITICAL RESULT CALLED TO, READ BACK BY AND VERIFIED WITH: DR. Kelleigh Skerritt, AT 3557 07/07/19 BY D. VANHOOK    Report Status PENDING  Incomplete   Organism ID, Bacteria STAPHYLOCOCCUS AUREUS  Final      Susceptibility   Staphylococcus aureus - MIC*    CIPROFLOXACIN <=0.5 SENSITIVE Sensitive     ERYTHROMYCIN <=0.25 SENSITIVE Sensitive     GENTAMICIN <=0.5 SENSITIVE Sensitive     OXACILLIN 0.5 SENSITIVE Sensitive     TETRACYCLINE <=1 SENSITIVE Sensitive     VANCOMYCIN 1 SENSITIVE Sensitive     TRIMETH/SULFA <=10 SENSITIVE Sensitive     CLINDAMYCIN <=0.25 SENSITIVE Sensitive     RIFAMPIN <=0.5 SENSITIVE Sensitive     Inducible Clindamycin Value in next row Sensitive      NEGATIVEPerformed at [facility] [address] [place], [place] [address]    * FEW STAPHYLOCOCCUS AUREUS    RADIOLOGY:  [place] EKG SITE RITE  Result Date: 07/07/2019 If Site Rite image not attached, placement could not be confirmed due to current cardiac rhythm.    CODE STATUS:     Code Status Orders  (From admission, onward)         Start     Ordered   07/04/19 1936  Full code  Continuous     07/04/19 1936        Code Status History    Date Active Date Inactive Code Status Order  ID Comments User Context   03/29/2016 1141 03/30/2016 1450 Full Code [id number]  Gladstone Lighter, MD ED   Advance Care Planning Activity       TOTAL TIME TAKING CARE OF THIS PATIENT: *40* minutes.    Fritzi Mandes M.D  [facility]    CC: Primary care physician; Barbaraann Boys, MD

## 2019-07-09 NOTE — Discharge Instructions (Signed)
F/u Neurosurgery in 2-3 weeks

## 2019-07-09 NOTE — TOC Transition Note (Signed)
Transition of Care [facility]) - CM/SW Discharge Note   Patient Details  Name: Dana Lewis MRN: [id number] Date of Birth: 08-06-1975  [facility]) CM/SW Contact:  Elease Hashimoto, LCSW Phone Number: 07/09/2019, 9:39 AM   Clinical Narrative:   Spoke with [facility] who has set up [facility] for pt;s continous infusion. She is hopeful it will be by 2:00 pm it all can be pulled together. She lives with husband and is independent prior to admission. She was driving prior to admission. Has a PCP and no concerns regarding obtaining meds. Once IV antibiotics set up will be ready for discharge.  12:18 [facility] spoke with pt and will be here at 3:30 pm to do teaching and hook up then will be ready for DC home.     Barriers to Discharge: Barriers Resolved   Patient Goals and CMS Choice Patient states their goals for this hospitalization and ongoing recovery are:: Glad to be getting home, husband here and will take home today CMS Medicare.gov Compare Post Acute Care list provided to:: Patient Choice offered to / list presented to : Patient  Discharge Placement                       Discharge Plan and Services In-house Referral: Clinical Social Work   Post Acute Care Choice: Home Health                    HH Arranged: RN [facility] Agency: Other - See comment([facility]) Date [place]: 07/09/19 Time [place]: [phone] Representative spoke with at [place]: [facility]  Social Determinants of Health (SDOH) Interventions     Readmission Risk Interventions No flowsheet data found.

## 2019-07-09 NOTE — Plan of Care (Signed)
  Problem: Education: Goal: Knowledge of General Education information will improve Description: Including pain rating scale, medication(s)/side effects and non-pharmacologic comfort measures Outcome: Progressing   Problem: Clinical Measurements: Goal: Respiratory complications will improve Outcome: Progressing Goal: Cardiovascular complication will be avoided Outcome: Progressing   Problem: Activity: Goal: Risk for activity intolerance will decrease Outcome: Progressing Pt ambulating well in room from bed to bathroom with no assistance needed   Problem: Coping: Goal: Level of anxiety will decrease Outcome: Progressing   Problem: Elimination: Goal: Will not experience complications related to urinary retention Outcome: Progressing Pt voiding well this shift

## 2019-07-11 LAB — AEROBIC/ANAEROBIC CULTURE W GRAM STAIN (SURGICAL/DEEP WOUND)

## 2019-07-11 LAB — CULTURE, BLOOD (ROUTINE X 2)
Culture: NO GROWTH
Culture: NO GROWTH
Special Requests: ADEQUATE
Special Requests: ADEQUATE

## 2019-07-13 ENCOUNTER — Ambulatory Visit: Payer: BC Managed Care – PPO

## 2019-07-31 ENCOUNTER — Other Ambulatory Visit: Payer: Self-pay

## 2019-07-31 ENCOUNTER — Encounter: Payer: Self-pay | Admitting: Infectious Diseases

## 2019-07-31 ENCOUNTER — Ambulatory Visit: Payer: BC Managed Care – PPO | Attending: Infectious Diseases | Admitting: Infectious Diseases

## 2019-07-31 DIAGNOSIS — R238 Other skin changes: Secondary | ICD-10-CM | POA: Diagnosis not present

## 2019-07-31 DIAGNOSIS — Z792 Long term (current) use of antibiotics: Secondary | ICD-10-CM | POA: Diagnosis not present

## 2019-07-31 DIAGNOSIS — Z87891 Personal history of nicotine dependence: Secondary | ICD-10-CM | POA: Diagnosis not present

## 2019-07-31 DIAGNOSIS — Z95828 Presence of other vascular implants and grafts: Secondary | ICD-10-CM | POA: Insufficient documentation

## 2019-07-31 DIAGNOSIS — M0008 Staphylococcal arthritis, vertebrae: Secondary | ICD-10-CM

## 2019-07-31 DIAGNOSIS — M4657 Other infective spondylopathies, lumbosacral region: Secondary | ICD-10-CM | POA: Insufficient documentation

## 2019-07-31 DIAGNOSIS — Z79899 Other long term (current) drug therapy: Secondary | ICD-10-CM | POA: Diagnosis not present

## 2019-07-31 DIAGNOSIS — Z7982 Long term (current) use of aspirin: Secondary | ICD-10-CM | POA: Diagnosis not present

## 2019-07-31 NOTE — Patient Instructions (Addendum)
you are here for follow up- you are doing well with no back pain- you are on nafcillin IV continuous until 6/30. Your ESR 45 ( previous ESR 72) and improving. You have developed a blister on your rt leg following a possible insect bite you sustained in your yard on Sunday. Keep an eye on the blister and watch for any progression or other sites developing

## 2019-07-31 NOTE — Progress Notes (Signed)
NAME: Dana Lewis  DOB: [birth year]  MRN: [id number]  Date/Time: 07/31/2019 9:52 AM   Subjective:  Follow up visit after recent hospitalization  ?was recently in hospital  Between 07/04/19-07/09/19 with back pain and MRI done on 07/03/19 showed L5-S1 septic arthropathy. IR aspirated the space and it grew MSSA. Blood culture was negative. She was discharged on continuous nafcillin until 08/15/19. Pt is doing well No back pain at all. No fever or diarrhea or rash  She says she had back pain intermittently for many years . microdiscectomy in 09/2014 performed in [place].  Patient has had intermittent flareup of low back pain following a fall she had in 2019 which noted multilevel degenerative changes at L4 S1 In the hospital she had told that On June 15 2018 she was moving heavy furniture and felt a sharp pain in the back secondary to what she thought was a slipped disc.  The pain was radiating to the posterior aspect of the left lower extremity which prompted her to see PCP and received steroid taper and then saw neurosurgery who got MRI. Today she told me that she had another trauma to her back 1 1/2 yrs ago when her spouse stepped on her back. Wanted to know whether that could have caused infection- Told her that Staph aureus is an aggressive organism and would not have lingered for 18 months. This infection was a recent one  but a weak vertebra can be a place for bacteria  to  latch on .  We could not identify the source of the bacteria though there was a discussion of her getting skin lesion son her face like acne. Over the weekend on _0 16  . IR FLUORO GUIDED NEEDLE PLC  ASPIRATION/INJECTION LOC  07/06/2019  . TONSILLECTOMY      Social History   Socioeconomic History  . Marital status: Married    Spouse name: Not on file  . Number of children: Not on file  . Years of education: Not on file  . Highest education level: Not on file  Occupational History  . Not on file  Tobacco Use  . Smoking status: Former Research scientist (life sciences)  . Smokeless tobacco: Never Used  . Tobacco comment: quit 8 years ago  Vaping Use  . Vaping Use: Never used  Substance and Sexual Activity  . Alcohol use: Yes    Comment: occasional wine  . Drug use: No  . Sexual activity: Yes    Birth control/protection: Surgical, I.U.D.  Other Topics Concern  . Not on file  Social History Narrative   Lives at home with husband   Social Determinants of Health   Financial Resource Strain:   . Difficulty of Paying Living Expenses:   Food Insecurity:   . Worried About Charity fundraiser in the Last Year:   . Arboriculturist in the Last Year:   Transportation Needs:   . Film/video editor (Medical):   Marland Kitchen Lack of Transportation (Non-Medical):   Physical Activity:   . Days of Exercise per Week:   . Minutes of Exercise  per Session:   Stress:   . Feeling of Stress :   Social Connections:   . Frequency of Communication with Friends and Family:   . Frequency of Social Gatherings with Friends and Family:   . Attends Religious Services:   . Active Member of Clubs or Organizations:   . Attends Club or Organization Meetings:   . Marital Status:   Intimate Partner Violence:   . Fear of Current or Ex-Partner:   . Emotionally Abused:   . Physically Abused:   . Sexually Abused:     Family History  Problem Relation Age of Onset  . Diabetes Father   . Breast cancer Maternal Grandmother   . Diabetes Maternal Grandmother   . Diabetes Paternal Grandmother   . Stroke Paternal Grandmother    Allergies  Allergen Reactions  . Blueberry Flavor Anaphylaxis  . Cat Hair Extract Dermatitis, Itching,  Shortness Of Breath and Swelling  . Mold Extract [Trichophyton] Shortness Of Breath  . Fire Ant Hives and Swelling    ? Current Outpatient Medications  Medication Sig Dispense Refill  . aspirin 325 MG tablet Take 325 mg by mouth daily.    . busPIRone (BUSPAR) 10 MG tablet Take 10 mg by mouth 2 (two) times daily.   2  . fluticasone (FLONASE) 50 MCG/ACT nasal spray Place 1 spray into both nostrils daily.    . lisdexamfetamine (VYVANSE) 70 MG capsule Take 70 mg by mouth daily.    . montelukast (SINGULAIR) 10 MG tablet Take 10 mg by mouth daily.  11  . nafcillin IVPB Inject 12 g into the vein continuous. Indication: Staph aureus l5-s1 septic arthritis of the facet joint Medication: Nafcillin 12 g IV as a continuous infusion over 24 hours Last Day of Therapy: 08/15/19 [facility] Care Per Protocol: Labs weekly on Thursday while on IV antibiotics: _X_ CBC with differential _X_ CMP _X_ ESR _X_ Please pull PIC at completion of IV antibiotics Fax weekly labs to Dr.Ravishankar [phone] Clinic Follow Up Appt: 3 weeks Call [phone] to make appt Method of administration: Elastomeric (Continuous infusion) Method of administration may be changed at the discretion of home infusion pharmacist based upon assessment of the patient and/or caregiver's ability to self-administer the medication ordered. 38 Units 0  . oxyCODONE 10 MG TABS Take 1 tablet (10 mg total) by mouth every 6 (six) hours as needed for severe pain or breakthrough pain. 20 tablet 0   No current facility-administered medications for this visit.     Abtx:  Anti-infectives (From admission, onward)   None      REVIEW OF SYSTEMS:  Const: negative fever, negative chills, negative weight loss Eyes: negative diplopia or visual changes, negative eye pain ENT: negative coryza, negative sore throat Resp: negative cough, hemoptysis, dyspnea Cards: negative for chest pain, palpitations, lower extremity edema GU: negative for frequency,  dysuria and hematuria GI: Negative for abdominal pain, diarrhea, bleeding, constipation Skin: blister Heme: negative for easy bruising and gum/nose bleeding MS: negative for myalgias, arthralgias, back pain and muscle weakness Neurolo:negative for headaches, dizziness, vertigo, memory problems  Psych: negative for feelings of anxiety, depression  Endocrine: negative for thyroid, diabetes Allergy/Immunology- as above Objective:  VITALS:  BP 138/84   Pulse (!) 122   Temp 97.9 F (36.6 C)   Resp 16   Ht 5' 3" (1.6 m)   Wt 210 lb (95.3 kg)   SpO2 98%   BMI 37.20 kg/m  PHYSICAL EXAM:  General: Alert, cooperative, no distress, appears stated   age.  Head: Normocephalic, without obvious abnormality, atraumatic. Eyes: Conjunctivae clear, anicteric sclerae. Pupils are equal ENT Nares normal. No drainage or sinus tenderness. Lips, mucosa, and tongue normal. No Thrush Neck: Supple, symmetrical, no adenopathy, thyroid: non tender no carotid bruit and no JVD. Back: No CVA tenderness. Lungs: Clear to auscultation bilaterally. No Wheezing or Rhonchi. No rales. Heart: Regular rate and rhythm, no murmur, rub or gallop. Abdomen: Soft, non-tender,not distended. Bowel sounds normal. No masses Extremities: rt leg near ankle is a blister of 3 cm with no erythema around    Skin: No rashes or lesions. Or bruising Lymph: Cervical, supraclavicular normal. Neurologic: Grossly non-focal Pertinent Labs ESR down to 45  ? Impression/Recommendation ?L5-s1 facet joint septic arthritis due to MSSA On IV nafcillin and tolerating the continuous infusion well- One night the cap came loose and she bled but called the infusion company and it was fixed. Will complete 6 weeks on 08/15/19 Follow ESR PICC can be removed after that   Blister - rt leg likely after insect bite- asked her to keep an eye on it and also to notify me if there are other places they appear  .  ? ? ___________________________________________________ Discussed with patient in detail 

## 2019-08-15 ENCOUNTER — Other Ambulatory Visit: Payer: Self-pay | Admitting: Infectious Diseases

## 2019-08-15 MED ORDER — CEPHALEXIN 500 MG PO CAPS: 500.0000 mg | ORAL_CAPSULE | Freq: Four times a day (QID) | ORAL | 1 refills | Status: DC

## 2019-08-15 NOTE — Progress Notes (Signed)
Finishing nafcillin IV today- sent cephalexin 500mg  PO 6 hrs for 2 weeks for the facet sept arthritis

## 2019-08-30 ENCOUNTER — Other Ambulatory Visit: Payer: Self-pay

## 2019-08-30 ENCOUNTER — Encounter: Payer: Self-pay | Admitting: Infectious Diseases

## 2019-08-30 ENCOUNTER — Ambulatory Visit: Payer: BC Managed Care – PPO | Attending: Infectious Diseases | Admitting: Infectious Diseases

## 2019-08-30 ENCOUNTER — Other Ambulatory Visit
Admission: RE | Admit: 2019-08-30 | Discharge: 2019-08-30 | Disposition: A | Discharge status: Home / Self Care | Payer: BC Managed Care – PPO | Source: Ambulatory Visit | Attending: Infectious Diseases | Admitting: Infectious Diseases

## 2019-08-30 VITALS — BP 137/89 | HR 93 | Temp 97.6°F | Resp 16 | Ht 63.0 in | Wt 210.0 lb

## 2019-08-30 DIAGNOSIS — M5416 Radiculopathy, lumbar region: Secondary | ICD-10-CM | POA: Diagnosis present

## 2019-08-30 DIAGNOSIS — M0008 Staphylococcal arthritis, vertebrae: Secondary | ICD-10-CM | POA: Insufficient documentation

## 2019-08-30 DIAGNOSIS — A4901 Methicillin susceptible Staphylococcus aureus infection, unspecified site: Secondary | ICD-10-CM | POA: Insufficient documentation

## 2019-08-30 DIAGNOSIS — M869 Osteomyelitis, unspecified: Secondary | ICD-10-CM

## 2019-08-30 DIAGNOSIS — Z87891 Personal history of nicotine dependence: Secondary | ICD-10-CM | POA: Insufficient documentation

## 2019-08-30 DIAGNOSIS — Z79899 Other long term (current) drug therapy: Secondary | ICD-10-CM | POA: Insufficient documentation

## 2019-08-30 DIAGNOSIS — Z7982 Long term (current) use of aspirin: Secondary | ICD-10-CM | POA: Insufficient documentation

## 2019-08-30 LAB — C-REACTIVE PROTEIN: CRP: 0.6 mg/dL (ref <1.0)

## 2019-08-30 LAB — SEDIMENTATION RATE: Sed Rate: 11 mm/hr (ref 0–20)

## 2019-08-30 NOTE — Patient Instructions (Signed)
Today we did a culture of the rt ge wound-  You have completed the antibiotic for the back infection Will contact you with the result of the culture

## 2019-08-30 NOTE — Progress Notes (Signed)
NAME: Dana Lewis  DOB: February 11, 1976  MRN: [id number]  Date/Time: 08/30/2019 10:37 AM   Subjective:  Follow up visit  Completed treatment for facet joint infection   ?was recently in hospital  Between 07/04/19-07/09/19 with back pain and MRI done on 07/03/19 showed L5-S1 septic arthropathy. IR aspirated the space and it grew MSSA. Blood culture was negative. She completed continuous nafcillin until 08/15/19. She took 2 weeks of keflex following that. She had a blister on her rt leg a month ago which she thought was due to Insect bite- . She then noted 2 small blisters on the left foot- and saw a spider in her home- she thinks it could be brown recluse bite No fever or chills Doing well overall    Past Medical History:  Diagnosis Date  . ADHD   . Back pain   . Pneumonia   . Pulmonary embolism [facility])     Past Surgical History:  Procedure Laterality Date  . BACK SURGERY     2016  . IR FLUORO GUIDED NEEDLE PLC ASPIRATION/INJECTION LOC  07/06/2019  . TONSILLECTOMY      Social History   Socioeconomic History  . Marital status: Married    Spouse name: Not on file  . Number of children: Not on file  . Years of education: Not on file  . Highest education level: Not on file  Occupational History  . Not on file  Tobacco Use  . Smoking status: Former Research scientist (life sciences)  . Smokeless tobacco: Never Used  . Tobacco comment: quit 8 years ago  Vaping Use  . Vaping Use: Never used  Substance and Sexual Activity  . Alcohol use: Yes    Comment: occasional wine  . Drug use: No  . Sexual activity: Yes    Birth control/protection: Surgical, I.U.D.  Other Topics Concern  . Not on file  Social History Narrative   Lives at home with husband   Social Determinants of Health   Financial Resource Strain:   . Difficulty of Paying Living Expenses:   Food Insecurity:   . Worried About Charity fundraiser in the Last Year:   . Arboriculturist in the Last Year:   Transportation Needs:   . Lexicographer (Medical):   Marland Kitchen Lack of Transportation (Non-Medical):   Physical Activity:   . Days of Exercise per Week:   . Minutes of Exercise per Session:   Stress:   . Feeling of Stress :   Social Connections:   . Frequency of Communication with Friends and Family:   . Frequency of Social Gatherings with Friends and Family:   . Attends Religious Services:   . Active Member of Clubs or Organizations:   . Attends Archivist Meetings:   Marland Kitchen Marital Status:   Intimate Partner Violence:   . Fear of Current or Ex-Partner:   . Emotionally Abused:   Marland Kitchen Physically Abused:   . Sexually Abused:     Family History  Problem Relation Age of Onset  . Diabetes Father   . Breast cancer Maternal Grandmother   . Diabetes Maternal Grandmother   . Diabetes Paternal Grandmother   . Stroke Paternal Grandmother    Allergies  Allergen Reactions  . Blueberry Flavor Anaphylaxis  . Cat Hair Extract Dermatitis, Itching, Shortness Of Breath and Swelling  . Mold Extract [Trichophyton] Shortness Of Breath  . [employer] and Swelling    ? Current Outpatient Medications  Medication Sig Dispense Refill  .  aspirin 325 MG tablet Take 325 mg by mouth daily.    . busPIRone (BUSPAR) 10 MG tablet Take 10 mg by mouth 2 (two) times daily.   2  . cephALEXin (KEFLEX) 500 MG capsule Take 1 capsule (500 mg total) by mouth 4 (four) times daily. 56 capsule 1  . fluticasone (FLONASE) 50 MCG/ACT nasal spray Place 1 spray into both nostrils daily.    Marland Kitchen lisdexamfetamine (VYVANSE) 70 MG capsule Take 70 mg by mouth daily.    . montelukast (SINGULAIR) 10 MG tablet Take 10 mg by mouth daily.  11   No current facility-administered medications for this visit.     Abtx:  Anti-infectives (From admission, onward)   None      REVIEW OF SYSTEMS:  Const: negative fever, negative chills, negative weight loss Eyes: negative diplopia or visual changes, negative eye pain ENT: negative coryza, negative sore  throat Resp: negative cough, hemoptysis, dyspnea Cards: negative for chest pain, palpitations, lower extremity edema GU: negative for frequency, dysuria and hematuria GI: Negative for abdominal pain, diarrhea, bleeding, constipation Skin: blister which has a necrotic scab- says it is spider bite as she had another blister on the left foot as well Heme: negative for easy bruising and gum/nose bleeding MS: negative for myalgias, arthralgias, back pain and muscle weakness Neurolo:negative for headaches, dizziness, vertigo, memory problems  Psych: negative for feelings of anxiety, depression  Endocrine: negative for thyroid, diabetes Allergy/Immunology- as above Objective:  VITALS:  BP 137/89   Pulse 93   Temp 97.6 F (36.4 C)   Resp 16   Ht '5\' 3"'  (1.6 m)   Wt 210 lb (95.3 kg)   SpO2 97%   BMI 37.20 kg/m  PHYSICAL EXAM:  General: Alert, cooperative, no distress, looks well  Extremities: 07/31/19   08/30/19      Skin:as above. Neurologic: Grossly non-focal Pertinent Labs ESR 27 from 08/15/19  ? 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 ? ___________________________________________________ Discussed with patient in detail

## 2019-09-01 LAB — AEROBIC CULTURE W GRAM STAIN (SUPERFICIAL SPECIMEN)
Culture: NO GROWTH
Gram Stain: NONE SEEN

## 2019-12-11 ENCOUNTER — Ambulatory Visit
Admission: RE | Admit: 2019-12-11 | Discharge: 2019-12-11 | Disposition: A | Discharge status: Home / Self Care | Payer: BC Managed Care – PPO | Source: Ambulatory Visit | Attending: Pediatrics | Admitting: Pediatrics

## 2019-12-11 ENCOUNTER — Other Ambulatory Visit: Payer: Self-pay

## 2019-12-11 DIAGNOSIS — R928 Other abnormal and inconclusive findings on diagnostic imaging of breast: Secondary | ICD-10-CM | POA: Diagnosis not present

## 2019-12-11 DIAGNOSIS — R921 Mammographic calcification found on diagnostic imaging of breast: Secondary | ICD-10-CM | POA: Diagnosis present

## 2020-03-24 ENCOUNTER — Other Ambulatory Visit: Payer: Self-pay | Admitting: Pediatrics

## 2020-03-24 DIAGNOSIS — R920 Mammographic microcalcification found on diagnostic imaging of breast: Secondary | ICD-10-CM

## 2020-04-15 ENCOUNTER — Encounter: Payer: Self-pay | Admitting: Emergency Medicine

## 2020-04-15 ENCOUNTER — Emergency Department
Admission: EM | Admit: 2020-04-15 | Discharge: 2020-04-15 | Disposition: A | Discharge status: Home / Self Care | Payer: BC Managed Care – PPO | Attending: Emergency Medicine | Admitting: Emergency Medicine

## 2020-04-15 ENCOUNTER — Emergency Department: Payer: BC Managed Care – PPO

## 2020-04-15 ENCOUNTER — Other Ambulatory Visit: Payer: Self-pay

## 2020-04-15 DIAGNOSIS — Z87891 Personal history of nicotine dependence: Secondary | ICD-10-CM | POA: Diagnosis not present

## 2020-04-15 DIAGNOSIS — Z7982 Long term (current) use of aspirin: Secondary | ICD-10-CM | POA: Diagnosis not present

## 2020-04-15 DIAGNOSIS — R0789 Other chest pain: Secondary | ICD-10-CM | POA: Insufficient documentation

## 2020-04-15 DIAGNOSIS — R1011 Right upper quadrant pain: Secondary | ICD-10-CM | POA: Diagnosis not present

## 2020-04-15 DIAGNOSIS — J45909 Unspecified asthma, uncomplicated: Secondary | ICD-10-CM | POA: Insufficient documentation

## 2020-04-15 HISTORY — DX: Unspecified asthma, uncomplicated: J45.909

## 2020-04-15 LAB — COMPREHENSIVE METABOLIC PANEL
ALT: 21 U/L (ref 0–44)
AST: 14 U/L — ABNORMAL LOW (ref 15–41)
Albumin: 3.5 g/dL (ref 3.5–5.0)
Alkaline Phosphatase: 53 U/L (ref 38–126)
Anion gap: 7 (ref 5–15)
BUN: 11 mg/dL (ref 6–20)
CO2: 22 mmol/L (ref 22–32)
Calcium: 9.1 mg/dL (ref 8.9–10.3)
Chloride: 107 mmol/L (ref 98–111)
Creatinine, Ser: 0.6 mg/dL (ref 0.44–1.00)
GFR, Estimated: >60 mL/min (ref >60)
Glucose, Bld: 107 mg/dL — ABNORMAL HIGH (ref 70–99)
Potassium: 4.2 mmol/L (ref 3.5–5.1)
Sodium: 136 mmol/L (ref 135–145)
Total Bilirubin: 0.7 mg/dL (ref 0.3–1.2)
Total Protein: 6.5 g/dL (ref 6.5–8.1)

## 2020-04-15 LAB — URINALYSIS, COMPLETE (UACMP) WITH MICROSCOPIC
Bilirubin Urine: NEGATIVE
Glucose, UA: NEGATIVE mg/dL
Hgb urine dipstick: NEGATIVE
Ketones, ur: NEGATIVE mg/dL
Nitrite: NEGATIVE
Protein, ur: NEGATIVE mg/dL
Specific Gravity, Urine: 1.016 (ref 1.005–1.030)
pH: 6 (ref 5.0–8.0)

## 2020-04-15 LAB — CBC
HCT: 41.4 % (ref 36.0–46.0)
Hemoglobin: 14 g/dL (ref 12.0–15.0)
MCH: 29.7 pg (ref 26.0–34.0)
MCHC: 33.8 g/dL (ref 30.0–36.0)
MCV: 87.7 fL (ref 80.0–100.0)
Platelets: 322 10*3/uL (ref 150–400)
RBC: 4.72 MIL/uL (ref 3.87–5.11)
RDW: 13.1 % (ref 11.5–15.5)
WBC: 10.2 10*3/uL (ref 4.0–10.5)
nRBC: 0 % (ref 0.0–0.2)

## 2020-04-15 LAB — LIPASE, BLOOD: Lipase: 36 U/L (ref 11–51)

## 2020-04-15 LAB — POC URINE PREG, ED
Preg Test, Ur: NEGATIVE
Preg Test, Ur: NEGATIVE

## 2020-04-15 MED ORDER — CEFDINIR 300 MG PO CAPS: 300.0000 mg | ORAL_CAPSULE | Freq: Two times a day (BID) | ORAL | 0 refills | Status: AC

## 2020-04-15 MED ORDER — IOHEXOL 300 MG/ML  SOLN
100.0000 mL | Freq: Once | INTRAMUSCULAR | Status: AC | PRN
  Administered 2020-04-15: 100 mL via INTRAVENOUS

## 2020-04-15 NOTE — ED Provider Notes (Signed)
[facility] Emergency Department Provider Note   ____________________________________________   Event Date/Time   First MD Initiated Contact with Patient 04/15/20 1020     (approximate)  I have reviewed the triage vital signs and the nursing notes.   HISTORY  Chief Complaint Abdominal Pain (RUQ)    HPI Dana Lewis is a 45 y.o. female with a stated past medical history of back pain, anxiety, ADHD, and recent Covid infection approximately 1 month prior to arrival who presents for right anterior chest wall/right upper quadrant abdominal pain that has been presents over the last 1 week. Patient describes intermittent sudden sharp pain that does not radiate and is worsened with movement. Patient denies any relieving factors but states that it resolves spontaneously intermittently. Patient currently denies any vision changes, tinnitus, difficulty speaking, facial droop, sore throat, shortness of breath, nausea/vomiting/diarrhea, dysuria, or weakness/numbness/paresthesias in any extremity         Past Medical History:  Diagnosis Date  . ADHD   . Asthma   . Back pain   . Pneumonia   . Pulmonary embolism [facility])     Patient Active Problem List   Diagnosis Date Noted  . 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 Spinal abscess ([place]) 07/04/2019  . Thrombocytosis 07/04/2019  . Anxiety 07/04/2019  . ADHD 07/04/2019  . History of pulmonary embolism 04/14/2016  . Pulmonary embolism ([place]) 03/29/2016    Past Surgical History:  Procedure Laterality Date  . BACK SURGERY     2016  . IR FLUORO GUIDED NEEDLE PLC ASPIRATION/INJECTION LOC  07/06/2019  . TONSILLECTOMY      Prior to Admission medications   Medication Sig Start Date End Date Taking? Authorizing Provider  aspirin 325 MG tablet Take 325 mg by mouth daily.    [provider]  busPIRone (BUSPAR) 10 MG tablet Take 10 mg by mouth 2 (two) times daily.  05/26/16   [provider]   cephALEXin (KEFLEX) 500 MG capsule Take 1 capsule (500 mg total) by mouth 4 (four) times daily. 08/15/19   Tsosie Billing, MD  fluticasone (FLONASE) 50 MCG/ACT nasal spray Place 1 spray into both nostrils daily.    [provider]  lisdexamfetamine (VYVANSE) 70 MG capsule Take 70 mg by mouth daily.    [provider]  montelukast (SINGULAIR) 10 MG tablet Take 10 mg by mouth daily. 08/06/16   [provider]    Allergies Blueberry flavor, Cat hair extract, Mold extract [trichophyton], and Fire ant  Family History  Problem Relation Age of Onset  . Diabetes Father   . Breast cancer Maternal Grandmother   . Diabetes Maternal Grandmother   . Diabetes Paternal Grandmother   . Stroke Paternal Grandmother     Social History Social History   Tobacco Use  . Smoking status: Former Research scientist (life sciences)  . Smokeless tobacco: Never Used  . Tobacco comment: quit 8 years ago  Vaping Use  . Vaping Use: Never used  Substance Use Topics  . Alcohol use: Yes    Comment: occasional wine  . Drug use: No    Review of Systems Constitutional: No fever/chills Eyes: No visual changes. ENT: No sore throat. Cardiovascular: Endorses chest pain. Respiratory: Denies shortness of breath. Gastrointestinal: Endorses abdominal pain.  No nausea, no vomiting.  No diarrhea. Genitourinary: Negative for dysuria. Musculoskeletal: Negative for acute arthralgias Skin: Negative for rash. Neurological: Negative for headaches, weakness/numbness/paresthesias in any extremity Psychiatric: Negative for suicidal ideation/homicidal ideation   ____________________________________________   PHYSICAL  EXAM:  VITAL SIGNS: ED Triage Vitals  Enc Vitals Group     BP 04/15/20 0900 136/80     Pulse Rate 04/15/20 0900 93     Resp 04/15/20 0900 18     Temp 04/15/20 0900 98 F (36.7 C)     Temp Source 04/15/20 0900 Oral     SpO2 04/15/20 0900 99 %     Weight --      Height --      Head  Circumference --      Peak Flow --      Pain Score 04/15/20 1310 0     Pain Loc --      Pain Edu? --      Excl. in [place]? --    Constitutional: Alert and oriented. Well appearing and in no acute distress. Eyes: Conjunctivae are normal. PERRL. Head: Atraumatic. Nose: No congestion/rhinnorhea. Mouth/Throat: Mucous membranes are moist. Neck: No stridor Cardiovascular: Grossly normal heart sounds.  Good peripheral circulation. Respiratory: Normal respiratory effort.  No retractions. Gastrointestinal: Soft and nontender. No distention. Musculoskeletal: No obvious deformities. Tenderness palpation over right inferior anterior lateral chest wall Neurologic:  Normal speech and language. No gross focal neurologic deficits are appreciated. Skin:  Skin is warm and dry. No rash noted. Psychiatric: Mood and affect are normal. Speech and behavior are normal.  ____________________________________________   LABS (all labs ordered are listed, but only abnormal results are displayed)  Labs Reviewed  COMPREHENSIVE METABOLIC PANEL - Abnormal; Notable for the following components:      Result Value   Glucose, Bld 107 (*)    AST 14 (*)    All other components within normal limits  URINALYSIS, COMPLETE (UACMP) WITH MICROSCOPIC - Abnormal; Notable for the following components:   Color, Urine YELLOW (*)    APPearance HAZY (*)    Leukocytes,Ua TRACE (*)    Bacteria, UA RARE (*)    All other components within normal limits  LIPASE, BLOOD  CBC  POC URINE PREG, ED  POC URINE PREG, ED   ____________________________________________  EKG  ED ECG REPORT I, Naaman Plummer, the attending physician, personally viewed and interpreted this ECG.  Date: 04/15/2020 EKG Time: 0856 Rate: 91 Rhythm: normal sinus rhythm QRS Axis: normal Intervals: normal ST/T Wave abnormalities: normal Narrative Interpretation: no evidence of acute ischemia  ____________________________________________  RADIOLOGY  ED  MD interpretation: CT of the abdomen and pelvis with IV contrast shows no evidence of acute abnormalities including no intra-abdominal masses, significant free fluid, or signs of infection  Official radiology report(s): CT Abdomen Pelvis W Contrast  Result Date: 04/15/2020 CLINICAL DATA:  Acute right upper quadrant abdominal pain. EXAM: CT ABDOMEN AND PELVIS WITH CONTRAST TECHNIQUE: Multidetector CT imaging of the abdomen and pelvis was performed using the standard protocol following bolus administration of intravenous contrast. CONTRAST:  150mL OMNIPAQUE IOHEXOL 300 MG/ML  SOLN COMPARISON:  September 19, 2017. FINDINGS: Lower chest: No acute abnormality. Hepatobiliary: No focal liver abnormality is seen. No gallstones, gallbladder wall thickening, or biliary dilatation. Pancreas: Unremarkable. No pancreatic ductal dilatation or surrounding inflammatory changes. Spleen: Normal in size without focal abnormality. Adrenals/Urinary Tract: Adrenal glands are unremarkable. Kidneys are normal, without renal calculi, focal lesion, or hydronephrosis. Bladder is unremarkable. Stomach/Bowel: Stomach is within normal limits. Appendix appears normal. No evidence of bowel wall thickening, distention, or inflammatory changes. Vascular/Lymphatic: No significant vascular findings are present. No enlarged abdominal or pelvic lymph nodes. Reproductive: Uterus and bilateral adnexa are unremarkable. Other: No abdominal wall  hernia or abnormality. No abdominopelvic ascites. Musculoskeletal: No acute or significant osseous findings. IMPRESSION: No abnormality seen in the abdomen or pelvis. Electronically Signed   By: Marijo Conception M.D.   On: 04/15/2020 12:28    ____________________________________________   PROCEDURES  Procedure(s) performed (including Critical Care):  .1-3 Lead EKG Interpretation Performed by: Naaman Plummer, MD Authorized by: Naaman Plummer, MD     Interpretation: normal     ECG rate:  80   ECG rate  assessment: normal     Rhythm: sinus rhythm     Ectopy: none     Conduction: normal       ____________________________________________   INITIAL IMPRESSION / ASSESSMENT AND PLAN / ED COURSE  As part of my medical decision making, I reviewed the following data within the [facility] notes reviewed and incorporated, Labs reviewed, EKG interpreted, Old chart reviewed, Radiograph reviewed and Notes from prior ED visits reviewed and incorporated        Patients symptoms not typical for emergent causes of abdominal pain such as, but not limited to, appendicitis, abdominal aortic aneurysm, surgical biliary disease, pancreatitis, SBO, mesenteric ischemia, serious intra-abdominal bacterial illness. Presentation also not typical of gynecologic emergencies such as TOA, Ovarian Torsion, PID. Not Ectopic. Doubt atypical ACS. Patient does show signs of urinary tract infection will be treated with cefdinir as an outpatient Pt tolerating PO. Disposition: Patient will be discharged with strict return precautions and follow up with primary MD within 12-24 hours for further evaluation. Patient understands that this still may have an early presentation of an emergent medical condition such as appendicitis that will require a recheck.      ____________________________________________   FINAL CLINICAL IMPRESSION(S) / ED DIAGNOSES  Final diagnoses:  Right upper quadrant abdominal pain     ED Discharge Orders    None       Note:  This document was prepared using Dragon voice recognition software and may include unintentional dictation errors.   Naaman Plummer, MD 04/15/20 1351

## 2020-04-15 NOTE — ED Triage Notes (Signed)
Pt in with RUQ pain that began 1 wk ago, began suddenly with some sharpness, intermittent since. C/o some nausea since last night. No cp or sob. Also c/o urinary frequency recently, and some more pronounced low back pain.

## 2020-05-12 ENCOUNTER — Ambulatory Visit
Admission: RE | Admit: 2020-05-12 | Discharge: 2020-05-12 | Disposition: A | Discharge status: Home / Self Care | Payer: BC Managed Care – PPO | Source: Ambulatory Visit | Attending: Pediatrics | Admitting: Pediatrics

## 2020-05-12 ENCOUNTER — Other Ambulatory Visit: Payer: Self-pay

## 2020-05-12 DIAGNOSIS — R920 Mammographic microcalcification found on diagnostic imaging of breast: Secondary | ICD-10-CM | POA: Diagnosis not present

## 2020-10-26 ENCOUNTER — Emergency Department: Payer: BC Managed Care – PPO

## 2020-10-26 ENCOUNTER — Emergency Department
Admission: EM | Admit: 2020-10-26 | Discharge: 2020-10-26 | Disposition: A | Discharge status: Home / Self Care | Payer: BC Managed Care – PPO | Attending: Emergency Medicine | Admitting: Emergency Medicine

## 2020-10-26 ENCOUNTER — Other Ambulatory Visit: Payer: Self-pay

## 2020-10-26 DIAGNOSIS — Z87891 Personal history of nicotine dependence: Secondary | ICD-10-CM | POA: Diagnosis not present

## 2020-10-26 DIAGNOSIS — Z7982 Long term (current) use of aspirin: Secondary | ICD-10-CM | POA: Insufficient documentation

## 2020-10-26 DIAGNOSIS — K5792 Diverticulitis of intestine, part unspecified, without perforation or abscess without bleeding: Secondary | ICD-10-CM | POA: Insufficient documentation

## 2020-10-26 DIAGNOSIS — R1032 Left lower quadrant pain: Secondary | ICD-10-CM

## 2020-10-26 DIAGNOSIS — Z7951 Long term (current) use of inhaled steroids: Secondary | ICD-10-CM | POA: Diagnosis not present

## 2020-10-26 DIAGNOSIS — J45909 Unspecified asthma, uncomplicated: Secondary | ICD-10-CM | POA: Diagnosis not present

## 2020-10-26 LAB — URINALYSIS, COMPLETE (UACMP) WITH MICROSCOPIC
Bilirubin Urine: NEGATIVE
Glucose, UA: NEGATIVE mg/dL
Ketones, ur: NEGATIVE mg/dL
Leukocytes,Ua: NEGATIVE
Nitrite: NEGATIVE
Protein, ur: NEGATIVE mg/dL
Specific Gravity, Urine: 1.02 (ref 1.005–1.030)
pH: 6 (ref 5.0–8.0)

## 2020-10-26 LAB — COMPREHENSIVE METABOLIC PANEL
ALT: 22 U/L (ref 0–44)
AST: 16 U/L (ref 15–41)
Albumin: 3.5 g/dL (ref 3.5–5.0)
Alkaline Phosphatase: 68 U/L (ref 38–126)
Anion gap: 8 (ref 5–15)
BUN: 10 mg/dL (ref 6–20)
CO2: 25 mmol/L (ref 22–32)
Calcium: 8.9 mg/dL (ref 8.9–10.3)
Chloride: 104 mmol/L (ref 98–111)
Creatinine, Ser: 0.71 mg/dL (ref 0.44–1.00)
GFR, Estimated: >60 mL/min (ref >60)
Glucose, Bld: 100 mg/dL — ABNORMAL HIGH (ref 70–99)
Potassium: 4.2 mmol/L (ref 3.5–5.1)
Sodium: 137 mmol/L (ref 135–145)
Total Bilirubin: 0.6 mg/dL (ref 0.3–1.2)
Total Protein: 6.8 g/dL (ref 6.5–8.1)

## 2020-10-26 LAB — CBC
HCT: 41.8 % (ref 36.0–46.0)
Hemoglobin: 14.4 g/dL (ref 12.0–15.0)
MCH: 30.2 pg (ref 26.0–34.0)
MCHC: 34.4 g/dL (ref 30.0–36.0)
MCV: 87.6 fL (ref 80.0–100.0)
Platelets: 326 10*3/uL (ref 150–400)
RBC: 4.77 MIL/uL (ref 3.87–5.11)
RDW: 13 % (ref 11.5–15.5)
WBC: 17.2 10*3/uL — ABNORMAL HIGH (ref 4.0–10.5)
nRBC: 0 % (ref 0.0–0.2)

## 2020-10-26 LAB — LIPASE, BLOOD: Lipase: 27 U/L (ref 11–51)

## 2020-10-26 LAB — HCG, QUANTITATIVE, PREGNANCY: hCG, Beta Chain, Quant, S: <1 m[IU]/mL (ref <5)

## 2020-10-26 MED ORDER — IOHEXOL 350 MG/ML SOLN
75.0000 mL | Freq: Once | INTRAVENOUS | Status: AC | PRN
  Administered 2020-10-26: 75 mL via INTRAVENOUS

## 2020-10-26 MED ORDER — SODIUM CHLORIDE 0.9 % IV BOLUS
1000.0000 mL | Freq: Once | INTRAVENOUS | Status: AC
  Administered 2020-10-26: 1000 mL via INTRAVENOUS

## 2020-10-26 MED ORDER — ONDANSETRON 4 MG PO TBDP: 4.0000 mg | ORAL_TABLET | Freq: Three times a day (TID) | ORAL | 0 refills | Status: AC | PRN

## 2020-10-26 MED ORDER — TRAMADOL HCL 50 MG PO TABS: 50.0000 mg | ORAL_TABLET | Freq: Four times a day (QID) | ORAL | 0 refills | Status: AC | PRN

## 2020-10-26 MED ORDER — CIPROFLOXACIN HCL 500 MG PO TABS: 500.0000 mg | ORAL_TABLET | Freq: Two times a day (BID) | ORAL | 0 refills | Status: DC

## 2020-10-26 MED ORDER — METRONIDAZOLE 500 MG PO TABS: 500.0000 mg | ORAL_TABLET | Freq: Three times a day (TID) | ORAL | 0 refills | Status: DC

## 2020-10-26 MED ORDER — MORPHINE SULFATE (PF) 4 MG/ML IV SOLN
4.0000 mg | Freq: Once | INTRAVENOUS | Status: AC
  Administered 2020-10-26: 4 mg via INTRAVENOUS
  Filled 2020-10-26: qty 1

## 2020-10-26 MED ORDER — KETOROLAC TROMETHAMINE 30 MG/ML IJ SOLN
15.0000 mg | INTRAMUSCULAR | Status: AC
  Administered 2020-10-26: 15 mg via INTRAVENOUS
  Filled 2020-10-26: qty 1

## 2020-10-26 NOTE — ED Triage Notes (Signed)
Pt reports for the last 5 days her lower abd has had some shooting pain and been swelling. Pt reports she feels bloated and swollen. Pt reports pain is worse with movement.

## 2020-10-26 NOTE — ED Notes (Signed)
Pt presents to the ED with c/o of lower ABD pain that is more prominent in the LLQ especially on palpation. Pt denies urinary symptoms. Pt denies fevers or chills. Pt denies N/V/D. Pt states last BM yesterday and states her BM pattern has been normal. Pt states this has been ongoing for 5 days.

## 2020-10-26 NOTE — ED Provider Notes (Addendum)
[facility] Emergency Department Provider Note  ____________________________________________  Time seen: Approximately 8:35 AM  I have reviewed the triage vital signs and the nursing notes.   HISTORY  Chief Complaint Abdominal Pain    HPI Dana Lewis is a 45 y.o. female with a past history of anxiety, ADHD, pulmonary embolism who comes ED complaining of lower abdominal pain for the past 5 days, constant, waxing and waning, worse with movement, nonradiating, 8/10 in intensity currently.  Denies nausea vomiting or diarrhea.  No constipation.  Had a normal bowel movement today.  No fever or chills.,  No unusual vaginal bleeding or discharge, no dysuria.    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 Authorizing Provider  busPIRone (BUSPAR) 10 MG tablet Take 10 mg by mouth 2 (two) times daily.  05/26/16  Yes [provider]  ciprofloxacin (CIPRO) 500 MG tablet Take 1 tablet (500 mg total) by mouth 2 (two) times daily. 10/26/20  Yes Carrie Mew, MD  dextroamphetamine (DEXTROSTAT) 10 MG tablet Take 30 mg by mouth daily. 10/17/20  Yes [provider]  lisdexamfetamine (VYVANSE) 70 MG capsule Take 70 mg by mouth daily.   Yes [provider]  metroNIDAZOLE (FLAGYL) 500 MG tablet Take 1 tablet (500 mg total) by mouth 3 (three) times  daily. 10/26/20  Yes Carrie Mew, MD  ondansetron (ZOFRAN ODT) 4 MG disintegrating tablet Take 1 tablet (4 mg total) by mouth every 8 (eight) hours as needed for nausea or vomiting. 10/26/20  Yes Carrie Mew, MD  traMADol (ULTRAM) 50 MG tablet Take 1 tablet (50 mg total) by mouth every 6 (six) hours as needed for up to 3 days. 10/26/20 10/29/20 Yes Carrie Mew, MD  aspirin 325 MG tablet Take 325 mg by mouth daily.    [provider]  cephALEXin (KEFLEX) 500 MG capsule Take 1 capsule (500 mg total) by mouth 4 (four) times daily. Patient not taking: No sig reported 08/15/19   Tsosie Billing, MD  fluticasone (FLONASE) 50 MCG/ACT nasal spray Place 1 spray into both nostrils daily.    [provider]  montelukast (SINGULAIR) 10 MG tablet Take 10 mg by mouth daily. 08/06/16   [provider]     Allergies Blueberry flavor, Cat hair extract, Mold extract [trichophyton], and Fire ant   Family History  Problem Relation Age of Onset   Diabetes Father    Breast cancer Maternal Grandmother    Diabetes Maternal Grandmother    Diabetes Paternal Grandmother    Stroke Paternal Grandmother     Social History Social History   Tobacco Use   Smoking status: Former   Smokeless tobacco: Never   Tobacco comments:    quit 8 years ago  Vaping Use   Vaping Use:  Never used  Substance Use Topics   Alcohol use: Yes    Comment: occasional wine   Drug use: No    Review of Systems  Constitutional:   No fever or chills.  ENT:   No sore throat. No rhinorrhea. Cardiovascular:   No chest pain or syncope. Respiratory:   No dyspnea or cough. Gastrointestinal: Positive as above for abdominal pain without vomiting and diarrhea.  Musculoskeletal:   Negative for focal pain or swelling All other systems reviewed and are negative except as documented above in ROS and HPI.  ____________________________________________   PHYSICAL EXAM:  VITAL SIGNS: ED Triage  Vitals  Enc Vitals Group     BP 10/26/20 0826 117/67     Pulse Rate 10/26/20 0826 (!) 105     Resp 10/26/20 0826 18     Temp 10/26/20 0826 98 F (36.7 C)     Temp src --      SpO2 10/26/20 0826 100 %     Weight --      Height --      Head Circumference --      Peak Flow --      Pain Score 10/26/20 0820 8     Pain Loc --      Pain Edu? --      Excl. in [place]? --     Vital signs reviewed, nursing assessments reviewed.   Constitutional:   Alert and oriented. Non-toxic appearance. Eyes:   Conjunctivae are normal. EOMI. PERRL. ENT      Head:   Normocephalic and atraumatic.      Nose:   Wearing a mask.      Mouth/Throat:   Wearing a mask.      Neck:   No meningismus. Full ROM. Hematological/Lymphatic/Immunilogical:   No cervical lymphadenopathy. Cardiovascular:   RRR. Symmetric bilateral radial and DP pulses.  No murmurs. Cap refill less than 2 seconds. Respiratory:   Normal respiratory effort without tachypnea/retractions. Breath sounds are clear and equal bilaterally. No wheezes/rales/rhonchi. Gastrointestinal:   Soft with diffuse lower abdominal tenderness, worse on the left. Non distended.  No rebound, rigidity, or guarding.  Musculoskeletal:   Normal range of motion in all extremities. No joint effusions.  No lower extremity tenderness.  No edema. Neurologic:   Normal speech and language.  Motor grossly intact. No acute focal neurologic deficits are appreciated.  Skin:    Skin is warm, dry and intact. No rash noted.  No petechiae, purpura, or bullae.  ____________________________________________    LABS (pertinent positives/negatives) (all labs ordered are listed, but only abnormal results are displayed) Labs Reviewed  COMPREHENSIVE METABOLIC PANEL - Abnormal; Notable for the following components:      Result Value   Glucose, Bld 100 (*)    All other components within normal limits  CBC - Abnormal; Notable for the following components:   WBC 17.2 (*)    All other  components within normal limits  URINALYSIS, COMPLETE (UACMP) WITH MICROSCOPIC - Abnormal; Notable for the following components:   APPearance CLOUDY (*)    Hgb urine dipstick TRACE (*)    Bacteria, UA FEW (*)    All other components within normal limits  LIPASE, BLOOD  HCG, QUANTITATIVE, PREGNANCY  POC URINE PREG, ED   ____________________________________________   EKG    ____________________________________________    RADIOLOGY  CT ABDOMEN PELVIS W CONTRAST  Result Date: 10/26/2020 CLINICAL DATA:  45 year old female with lower abdominal pain maximal in the left lower quadrant x5 days.  EXAM: CT ABDOMEN AND PELVIS WITH CONTRAST TECHNIQUE: Multidetector CT imaging of the abdomen and pelvis was performed using the standard protocol following bolus administration of intravenous contrast. CONTRAST:  53m OMNIPAQUE IOHEXOL 350 MG/ML SOLN COMPARISON:  CT Abdomen and Pelvis 04/15/2020 and earlier. FINDINGS: Lower chest: Lower lung volumes but otherwise negative. Hepatobiliary: Negative liver and gallbladder. Pancreas: Negative. Spleen: Negative. Adrenals/Urinary Tract: Normal adrenal glands. There is mild left pararenal region inflammation secondary to the descending colon. Liver enhancement and contrast excretion is symmetric and within normal limits. No nephrolithiasis. Ureters are decompressed. Diminutive and unremarkable bladder. Stomach/Bowel: Redundant large bowel in the pelvis with retained stool. Long segment of moderately inflamed colon from the mid descending (coronal image 49) through the proximal sigmoid. There is asymmetric wall thickening in the affected descending segment up to 11 mm. And some intervening relatively unaffected bowel at the junction of the 2 segments. In the sigmoid inflammation is maximal about an area of diverticulosis (coronal image 37 and series 2, image 70. Diverticula throughout the 2 regions. Small volume of free fluid is layering in the left gutter. No  extraluminal gas. No regional mesenteric lymphadenopathy. The upstream large bowel is nondilated. Diverticulosis also noted in the transverse colon and at the hepatic flexure. The right colon is redundant and partially decompressed with normal appendix visible on coronal image 33. Terminal ileum is within normal limits. No dilated small bowel. Decompressed stomach. There is a distal duodenal diverticulum with no active inflammation measuring almost 5 cm in length on coronal image 44. No other free fluid or mesenteric inflammation.  No free air. Vascular/Lymphatic: Major arterial structures and the portal venous system appear patent and within normal limits. No lymphadenopathy. Reproductive: Negative. Other: No pelvic free fluid.  Incidental phleboliths. Musculoskeletal: Chronic disc, endplate, and facet degeneration at the lumbosacral junction. No acute osseous abnormality identified. IMPRESSION: 1. Positive for inflamed colon from the mid descending segment through the proximal sigmoid - although with some short segments of relative sparing in between. The descending colon involvement most resembles Acute Colitis, although with asymmetric bowel wall thickening. The sigmoid involvement most resembles Acute Diverticulitis. Small volume of free fluid layering in the left gutter. But no abscess or other complicating features. Recommended post treatment Endoscopy to exclude neoplasm which can sometimes have a similar appearance and presentation. 2. Diverticulosis of the more proximal large bowel. And there is a 5 cm distal duodenal diverticulum. Electronically Signed   By: HGenevie AnnM.D.   On: 10/26/2020 11:43    ____________________________________________   PROCEDURES Procedures  ____________________________________________  DIFFERENTIAL DIAGNOSIS   Diverticulitis, ovarian cyst, pelvic mass, appendicitis, cystitis, kidney stone  CLINICAL IMPRESSION / ASSESSMENT AND PLAN / ED COURSE  Medications ordered  in the ED: Medications  sodium chloride 0.9 % bolus 1,000 mL (1,000 mLs Intravenous Bolus 10/26/20 0854)  ketorolac (TORADOL) 30 MG/ML injection 15 mg (15 mg Intravenous Given 10/26/20 0854)  morphine 4 MG/ML injection 4 mg (4 mg Intravenous Given 10/26/20 1025)  iohexol (OMNIPAQUE) 350 MG/ML injection 75 mL (75 mLs Intravenous Contrast Given 10/26/20 1100)    Pertinent labs & imaging results that were available during my care of the patient were reviewed by me and considered in my medical decision making (see chart for details).  Anyely Luber was evaluated in Emergency Department on 10/26/2020 for the symptoms described in the history of present illness. She was evaluated in the context of the global COVID-19 pandemic, which necessitated consideration that the patient might be at risk for  infection with the SARS-CoV-2 virus that causes COVID-19. Institutional protocols and algorithms that pertain to the evaluation of patients at risk for COVID-19 are in a state of rapid change based on information released by regulatory bodies including the CDC and federal and state organizations. These policies and algorithms were followed during the patient's care in the ED.   Patient presents with worsening lower abdominal pain and bloating the last 5 days, has tenderness.  She is mildly tachycardic as well.  Will give IV fluids, Toradol for pain, obtain CT scan and labs.  ----------------------------------------- 12:11 PM on 10/26/2020 ----------------------------------------- Labs unremarkable, pain tolerable.  CT shows uncomplicated diverticulitis.  Counseled patient on these findings.  Cipro Flagyl Zofran NSAIDs, limited prescription of tramadol, follow-up with her PCP.  She is comfortable with outpatient management.      ____________________________________________   FINAL CLINICAL IMPRESSION(S) / ED DIAGNOSES    Final diagnoses:  Left lower quadrant abdominal pain  Acute diverticulitis     ED  Discharge Orders          Ordered    ciprofloxacin (CIPRO) 500 MG tablet  2 times daily        10/26/20 1210    metroNIDAZOLE (FLAGYL) 500 MG tablet  3 times daily        10/26/20 1210    traMADol (ULTRAM) 50 MG tablet  Every 6 hours PRN        10/26/20 1210    ondansetron (ZOFRAN ODT) 4 MG disintegrating tablet  Every 8 hours PRN        10/26/20 1210            Portions of this note were generated with dragon dictation software. Dictation errors may occur despite best attempts at proofreading.    Carrie Mew, MD 10/26/20 [id number]    Carrie Mew, MD 10/26/20 [phone]

## 2020-10-26 NOTE — ED Notes (Signed)
D/C and new RX's discussed with pt, pt verbalized understanding. NAD noted. VSS. Pt ambulatory with steady gait on D/C.

## 2020-11-25 ENCOUNTER — Encounter (HOSPITAL_COMMUNITY): Payer: Self-pay | Admitting: Radiology

## 2021-06-26 ENCOUNTER — Other Ambulatory Visit: Payer: Self-pay | Admitting: Pediatrics

## 2021-06-26 DIAGNOSIS — R920 Mammographic microcalcification found on diagnostic imaging of breast: Secondary | ICD-10-CM

## 2021-07-17 ENCOUNTER — Ambulatory Visit
Admission: RE | Admit: 2021-07-17 | Discharge: 2021-07-17 | Disposition: A | Discharge status: Home / Self Care | Payer: BC Managed Care – PPO | Source: Ambulatory Visit | Attending: Pediatrics | Admitting: Pediatrics

## 2021-07-17 DIAGNOSIS — R920 Mammographic microcalcification found on diagnostic imaging of breast: Secondary | ICD-10-CM | POA: Insufficient documentation

## 2022-01-15 ENCOUNTER — Encounter: Payer: Self-pay | Admitting: Emergency Medicine

## 2022-01-15 ENCOUNTER — Ambulatory Visit (INDEPENDENT_AMBULATORY_CARE_PROVIDER_SITE_OTHER): Payer: BC Managed Care – PPO

## 2022-01-15 ENCOUNTER — Ambulatory Visit
Admission: EM | Admit: 2022-01-15 | Discharge: 2022-01-15 | Disposition: A | Discharge status: Home / Self Care | Payer: BC Managed Care – PPO | Attending: Family Medicine | Admitting: Family Medicine

## 2022-01-15 DIAGNOSIS — R059 Cough, unspecified: Secondary | ICD-10-CM

## 2022-01-15 MED ORDER — DOXYCYCLINE HYCLATE 100 MG PO CAPS
100.0000 mg | ORAL_CAPSULE | Freq: Two times a day (BID) | ORAL | 0 refills | Status: AC
Start: 2022-01-15 — End: 2022-01-25

## 2022-01-15 MED ORDER — BENZONATATE 200 MG PO CAPS: 200.0000 mg | ORAL_CAPSULE | Freq: Three times a day (TID) | ORAL | 0 refills | Status: AC | PRN

## 2022-01-15 MED ORDER — PREDNISONE 10 MG (21) PO TBPK: ORAL_TABLET | Freq: Every day | ORAL | 0 refills | Status: AC

## 2022-01-15 NOTE — ED Provider Notes (Signed)
[name]    CSN: [id number] Arrival date & time: 01/15/22  0910      History   Chief Complaint Chief Complaint  Patient presents with   Cough    HPI Dana Lewis is a 46 y.o. female.   HPI 75-year-old female presents with cough for 1 month.  PMH significant for history of PE, morbid obesity, PCOS, and anxiety.  Patient is accompanied by her significant other this morning.  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[facility])    Spinal abscess ([place]) 07/04/2019   Thrombocytosis 07/04/2019   Anxiety 07/04/2019   ADHD 07/04/2019   History of pulmonary embolism 04/14/2016   Pulmonary embolism ([place]) 03/29/2016    Past Surgical History:  Procedure Laterality Date   BACK SURGERY     2016   IR FLUORO GUIDED NEEDLE PLC ASPIRATION/INJECTION LOC  07/06/2019   TONSILLECTOMY      OB History     Gravida  2   Para  2   Term  2   Preterm      AB      Living  2      SAB      IAB      Ectopic      Multiple      Live Births  2            Home Medications    Prior to Admission medications   Medication Sig Start Date End Date Taking? Authorizing Provider  benzonatate (TESSALON) 200 MG capsule Take 1 capsule (200 mg total) by mouth 3 (three) times daily as needed for up to 7 days. 01/15/22 01/22/22 Yes Eliezer Lofts, FNP  doxycycline (VIBRAMYCIN) 100 MG capsule Take 1 capsule (100 mg total) by mouth 2 (two) times daily for 10 days. 01/15/22 01/25/22 Yes Eliezer Lofts, FNP  predniSONE (STERAPRED UNI-PAK 21 TAB) 10 MG (21) TBPK tablet Take by mouth daily. Take 6 tabs by mouth daily  for 2 days, then 5 tabs for 2 days, then 4 tabs for 2 days, then 3 tabs for 2 days, 2 tabs for 2 days, then 1 tab by mouth daily for 2 days 01/15/22  Yes Eliezer Lofts, FNP  aspirin 325 MG tablet Take 325 mg by  mouth daily.    [provider]  busPIRone (BUSPAR) 10 MG tablet Take 10 mg by mouth 2 (two) times daily.  05/26/16   [provider]  dextroamphetamine (DEXTROSTAT) 10 MG tablet Take 30 mg by mouth daily. 10/17/20   [provider]  fluticasone (FLONASE) 50 MCG/ACT nasal spray Place 1 spray into both nostrils daily.    [provider]  lisdexamfetamine (VYVANSE) 70 MG capsule Take 70 mg by mouth daily.    [provider]  montelukast (SINGULAIR) 10 MG tablet Take 10 mg by mouth daily. 08/06/16   [provider]  ondansetron (ZOFRAN ODT) 4 MG disintegrating tablet Take 1 tablet (4 mg total) by mouth every 8 (eight) hours as needed for nausea or vomiting. 10/26/20   Carrie Mew, MD    Family History Family History  Problem Relation Age of Onset   Diabetes Father    Breast cancer Maternal Grandmother    Diabetes Maternal Grandmother    Diabetes Paternal Grandmother  Stroke Paternal Grandmother     Social History Social History   Tobacco Use   Smoking status: Former   Smokeless tobacco: Never   Tobacco comments:    quit 8 years ago  Vaping Use   Vaping Use: Never used  Substance Use Topics   Alcohol use: Yes    Comment: occasional wine   Drug use: No     Allergies   Blueberry flavor, Cat hair extract, Mold extract [trichophyton], and Fire ant   Review of Systems Review of Systems  Respiratory:  Positive for cough.   All other systems reviewed and are negative.    Physical Exam Triage Vital Signs ED Triage Vitals  Enc Vitals Group     BP --      Pulse Rate 01/15/22 1003 97     Resp 01/15/22 1003 16     Temp 01/15/22 1003 98.2 F (36.8 C)     Temp Source 01/15/22 1003 Oral     SpO2 01/15/22 1003 98 %     Weight --      Height --      Head Circumference --      Peak Flow --      Pain Score 01/15/22 1005 0     Pain Loc --      Pain Edu? --      Excl. in [place]? --    No data found.  Updated Vital  Signs Pulse 97   Temp 98.2 F (36.8 C) (Oral)   Resp 16   LMP 01/06/2022   SpO2 98%    Physical Exam Vitals and nursing note reviewed.  Constitutional:      Appearance: Normal appearance. She is obese.  HENT:     Head: Normocephalic and atraumatic.     Right Ear: Tympanic membrane, ear canal and external ear normal.     Left Ear: Tympanic membrane, ear canal and external ear normal.     Mouth/Throat:     Mouth: Mucous membranes are moist.     Pharynx: Oropharynx is clear.  Eyes:     Extraocular Movements: Extraocular movements intact.     Conjunctiva/sclera: Conjunctivae normal.     Pupils: Pupils are equal, round, and reactive to light.  Cardiovascular:     Rate and Rhythm: Normal rate and regular rhythm.     Pulses: Normal pulses.     Heart sounds: Normal heart sounds.  Pulmonary:     Effort: Pulmonary effort is normal.     Breath sounds: Normal breath sounds. No wheezing, rhonchi or rales.  Musculoskeletal:        General: Normal range of motion.     Cervical back: Normal range of motion and neck supple. No tenderness.  Lymphadenopathy:     Cervical: No cervical adenopathy.  Skin:    General: Skin is warm and dry.  Neurological:     General: No focal deficit present.     Mental Status: She is alert and oriented to person, place, and time.      UC Treatments / Results  Labs (all labs ordered are listed, but only abnormal results are displayed) Labs Reviewed - No data to display  EKG   Radiology DG Chest 2 View  Result Date: 01/15/2022 CLINICAL DATA:  Cough x 1 month EXAM: CHEST - 2 VIEW COMPARISON:  Radiograph 08/08/2016 FINDINGS: Unchanged cardiomediastinal silhouette. There is no focal airspace consolidation. There is no pleural effusion or pneumothorax. No acute osseous abnormality. IMPRESSION: No evidence of acute  cardiopulmonary disease. Electronically Signed   By: Maurine Simmering M.D.   On: 01/15/2022 10:22    Procedures Procedures (including critical  care time)  Medications Ordered in UC Medications - No data to display  Initial Impression / Assessment and Plan / UC Course  I have reviewed the triage vital signs and the nursing notes.  Pertinent labs & imaging results that were available during my care of the patient were reviewed by me and considered in my medical decision making (see chart for details).     MDM: 1.  Cough, unspecified type-CXR revealed above, Rx'd doxycycline, Sterapred Unipak. Advised patient of chest x-ray results with hard copy provided to patient.  Advised patient to take medications as directed with food to completion.  Advised patient to take prednisone with first dose of doxycycline for the next 10 days.  Advised may take Tessalon daily or as needed for cough.  Encouraged patient to increase daily water intake to 64 ounces per day while taking these medications.  Advised if symptoms worsen and/or unresolved please follow-up with PCP or go to nearest ED for further evaluation.  Patient discharged home, hemodynamically stable. Final Clinical Impressions(s) / UC Diagnoses   Final diagnoses:  Cough, unspecified type     Discharge Instructions      Advised patient of chest x-ray results with hard copy provided to patient.  Advised patient to take medications as directed with food to completion.  Advised patient to take prednisone with first dose of doxycycline for the next 10 days.  Advised may take Tessalon daily or as needed for cough.  Encouraged patient to increase daily water intake to 64 ounces per day while taking these medications.  Advised if symptoms worsen and/or unresolved please follow-up with PCP or go to nearest ED for further evaluation.     ED Prescriptions     Medication Sig Dispense Auth. Provider   doxycycline (VIBRAMYCIN) 100 MG capsule Take 1 capsule (100 mg total) by mouth 2 (two) times daily for 10 days. 20 capsule Eliezer Lofts, FNP   predniSONE (STERAPRED UNI-PAK 21 TAB) 10 MG (21)  TBPK tablet Take by mouth daily. Take 6 tabs by mouth daily  for 2 days, then 5 tabs for 2 days, then 4 tabs for 2 days, then 3 tabs for 2 days, 2 tabs for 2 days, then 1 tab by mouth daily for 2 days 42 tablet Eliezer Lofts, FNP   benzonatate (TESSALON) 200 MG capsule Take 1 capsule (200 mg total) by mouth 3 (three) times daily as needed for up to 7 days. 40 capsule Eliezer Lofts, FNP      PDMP not reviewed this encounter.   Eliezer Lofts, [place] 01/15/22 1045

## 2022-01-15 NOTE — ED Triage Notes (Signed)
Pt states for the last month she has had cough and congestion that have worsened in the last week. She has been taking OTC cold meds with no relief. Afebrile.

## 2022-01-15 NOTE — Discharge Instructions (Addendum)
Advised patient of chest x-ray results with hard copy provided to patient.  Advised patient to take medications as directed with food to completion.  Advised patient to take prednisone with first dose of doxycycline for the next 10 days.  Advised may take Tessalon daily or as needed for cough.  Encouraged patient to increase daily water intake to 64 ounces per day while taking these medications.  Advised if symptoms worsen and/or unresolved please follow-up with PCP or go to nearest ED for further evaluation.
